# Patient Record
Sex: Female | Born: 1970
Health system: Southern US, Community
[De-identification: ages and names within clinical notes are randomized; demographics above are authoritative.]

## PROBLEM LIST (undated history)

## (undated) DIAGNOSIS — J189 Pneumonia, unspecified organism: Secondary | ICD-10-CM

## (undated) DIAGNOSIS — N6452 Nipple discharge: Secondary | ICD-10-CM

## (undated) DIAGNOSIS — J40 Bronchitis, not specified as acute or chronic: Secondary | ICD-10-CM

## (undated) DIAGNOSIS — T8859XA Other complications of anesthesia, initial encounter: Secondary | ICD-10-CM

## (undated) DIAGNOSIS — T4145XA Adverse effect of unspecified anesthetic, initial encounter: Secondary | ICD-10-CM

## (undated) HISTORY — PX: TONSILECTOMY, ADENOIDECTOMY, BILATERAL MYRINGOTOMY AND TUBES: SHX2538

## (undated) HISTORY — PX: OTHER SURGICAL HISTORY: SHX169

## (undated) HISTORY — PX: TONSILLECTOMY: SUR1361

## (undated) HISTORY — PX: CHOLECYSTECTOMY: SHX55

## (undated) HISTORY — DX: Nipple discharge: N64.52

## (undated) HISTORY — PX: GANGLION CYST EXCISION: SHX1691

## (undated) HISTORY — PX: DILATION AND CURETTAGE OF UTERUS: SHX78

## (undated) HISTORY — DX: Bronchitis, not specified as acute or chronic: J40

## (undated) HISTORY — PX: LEG SURGERY: SHX1003

## (undated) HISTORY — DX: Pneumonia, unspecified organism: J18.9

---

## 1998-09-23 ENCOUNTER — Encounter: Admission: RE | Admit: 1998-09-23 | Discharge: 1998-12-22 | Payer: Self-pay | Admitting: Internal Medicine

## 1999-01-27 ENCOUNTER — Other Ambulatory Visit: Admission: RE | Admit: 1999-01-27 | Discharge: 1999-01-27 | Payer: Self-pay | Admitting: *Deleted

## 2000-02-28 ENCOUNTER — Emergency Department (HOSPITAL_COMMUNITY): Admission: EM | Admit: 2000-02-28 | Discharge: 2000-02-28 | Payer: Self-pay | Admitting: Emergency Medicine

## 2000-03-15 ENCOUNTER — Other Ambulatory Visit: Admission: RE | Admit: 2000-03-15 | Discharge: 2000-03-15 | Payer: Self-pay | Admitting: *Deleted

## 2000-05-10 ENCOUNTER — Other Ambulatory Visit: Admission: RE | Admit: 2000-05-10 | Discharge: 2000-05-10 | Payer: Self-pay | Admitting: *Deleted

## 2000-07-04 ENCOUNTER — Other Ambulatory Visit: Admission: RE | Admit: 2000-07-04 | Discharge: 2000-07-04 | Payer: Self-pay | Admitting: Otolaryngology

## 2000-07-04 ENCOUNTER — Encounter (INDEPENDENT_AMBULATORY_CARE_PROVIDER_SITE_OTHER): Payer: Self-pay

## 2001-02-20 ENCOUNTER — Encounter: Payer: Self-pay | Admitting: Emergency Medicine

## 2001-02-20 ENCOUNTER — Emergency Department (HOSPITAL_COMMUNITY): Admission: EM | Admit: 2001-02-20 | Discharge: 2001-02-20 | Payer: Self-pay | Admitting: Emergency Medicine

## 2001-04-09 ENCOUNTER — Other Ambulatory Visit: Admission: RE | Admit: 2001-04-09 | Discharge: 2001-04-09 | Payer: Self-pay | Admitting: *Deleted

## 2002-03-06 ENCOUNTER — Other Ambulatory Visit: Admission: RE | Admit: 2002-03-06 | Discharge: 2002-03-06 | Payer: Self-pay | Admitting: *Deleted

## 2003-07-29 ENCOUNTER — Other Ambulatory Visit: Admission: RE | Admit: 2003-07-29 | Discharge: 2003-07-29 | Payer: Self-pay | Admitting: *Deleted

## 2004-07-21 ENCOUNTER — Other Ambulatory Visit: Admission: RE | Admit: 2004-07-21 | Discharge: 2004-07-21 | Payer: Self-pay | Admitting: Obstetrics and Gynecology

## 2004-07-27 ENCOUNTER — Encounter: Admission: RE | Admit: 2004-07-27 | Discharge: 2004-07-27 | Payer: Self-pay | Admitting: Obstetrics and Gynecology

## 2005-11-23 ENCOUNTER — Ambulatory Visit: Payer: Self-pay | Admitting: Internal Medicine

## 2005-11-30 ENCOUNTER — Ambulatory Visit (HOSPITAL_COMMUNITY): Admission: RE | Admit: 2005-11-30 | Discharge: 2005-11-30 | Payer: Self-pay | Admitting: Internal Medicine

## 2005-12-06 ENCOUNTER — Emergency Department (HOSPITAL_COMMUNITY): Admission: EM | Admit: 2005-12-06 | Discharge: 2005-12-06 | Payer: Self-pay | Admitting: Emergency Medicine

## 2005-12-09 ENCOUNTER — Ambulatory Visit: Payer: Self-pay | Admitting: Internal Medicine

## 2005-12-16 ENCOUNTER — Ambulatory Visit (HOSPITAL_COMMUNITY): Admission: RE | Admit: 2005-12-16 | Discharge: 2005-12-16 | Payer: Self-pay | Admitting: Internal Medicine

## 2006-01-25 ENCOUNTER — Ambulatory Visit (HOSPITAL_COMMUNITY): Admission: RE | Admit: 2006-01-25 | Discharge: 2006-01-25 | Payer: Self-pay | Admitting: *Deleted

## 2010-02-01 ENCOUNTER — Inpatient Hospital Stay (HOSPITAL_COMMUNITY)
Admission: EM | Admit: 2010-02-01 | Discharge: 2010-02-05 | Payer: Self-pay | Source: Home / Self Care | Attending: Internal Medicine | Admitting: Internal Medicine

## 2010-02-02 LAB — POCT I-STAT 3, ART BLOOD GAS (G3+)
Acid-Base Excess: 5 mmol/L — ABNORMAL HIGH (ref 0.0–2.0)
Bicarbonate: 28.2 mEq/L — ABNORMAL HIGH (ref 20.0–24.0)
O2 Saturation: 92 %
TCO2: 29 mmol/L (ref 0–100)
pCO2 arterial: 37 mmHg (ref 35.0–45.0)
pH, Arterial: 7.491 — ABNORMAL HIGH (ref 7.350–7.400)
pO2, Arterial: 59 mmHg — ABNORMAL LOW (ref 80.0–100.0)

## 2010-02-02 LAB — URINALYSIS, ROUTINE W REFLEX MICROSCOPIC
Bilirubin Urine: NEGATIVE
Ketones, ur: 15 mg/dL — AB
Leukocytes, UA: NEGATIVE
Nitrite: POSITIVE — AB
Protein, ur: 30 mg/dL — AB
Specific Gravity, Urine: 1.011 (ref 1.005–1.030)
Urine Glucose, Fasting: 100 mg/dL — AB
Urobilinogen, UA: 1 mg/dL (ref 0.0–1.0)
pH: 6 (ref 5.0–8.0)

## 2010-02-02 LAB — CBC
HCT: 43.3 % (ref 36.0–46.0)
Hemoglobin: 13.9 g/dL (ref 12.0–15.0)
MCH: 25.8 pg — ABNORMAL LOW (ref 26.0–34.0)
MCHC: 32.1 g/dL (ref 30.0–36.0)
MCV: 80.3 fL (ref 78.0–100.0)
Platelets: 233 10*3/uL (ref 150–400)
RBC: 5.39 MIL/uL — ABNORMAL HIGH (ref 3.87–5.11)
RDW: 14.5 % (ref 11.5–15.5)
WBC: 6.4 10*3/uL (ref 4.0–10.5)

## 2010-02-02 LAB — BASIC METABOLIC PANEL
BUN: 8 mg/dL (ref 6–23)
CO2: 26 mEq/L (ref 19–32)
Calcium: 8.3 mg/dL — ABNORMAL LOW (ref 8.4–10.5)
Chloride: 97 mEq/L (ref 96–112)
Creatinine, Ser: 0.75 mg/dL (ref 0.4–1.2)
GFR calc Af Amer: >60 mL/min (ref >60)
GFR calc non Af Amer: >60 mL/min (ref >60)
Glucose, Bld: 251 mg/dL — ABNORMAL HIGH (ref 70–99)
Potassium: 3.7 mEq/L (ref 3.5–5.1)
Sodium: 135 mEq/L (ref 135–145)

## 2010-02-02 LAB — GLUCOSE, CAPILLARY: Glucose-Capillary: 336 mg/dL — ABNORMAL HIGH (ref 70–99)

## 2010-02-02 LAB — TROPONIN I: Troponin I: 0.02 ng/mL (ref 0.00–0.06)

## 2010-02-02 LAB — DIFFERENTIAL
Basophils Absolute: 0 10*3/uL (ref 0.0–0.1)
Basophils Relative: 0 % (ref 0–1)
Eosinophils Absolute: 0 10*3/uL (ref 0.0–0.7)
Eosinophils Relative: 0 % (ref 0–5)
Lymphocytes Relative: 11 % — ABNORMAL LOW (ref 12–46)
Lymphs Abs: 0.7 10*3/uL (ref 0.7–4.0)
Monocytes Absolute: 0.1 10*3/uL (ref 0.1–1.0)
Monocytes Relative: 2 % — ABNORMAL LOW (ref 3–12)
Neutro Abs: 5.6 10*3/uL (ref 1.7–7.7)
Neutrophils Relative %: 87 % — ABNORMAL HIGH (ref 43–77)

## 2010-02-02 LAB — URINE MICROSCOPIC-ADD ON

## 2010-02-02 LAB — CK TOTAL AND CKMB (NOT AT ARMC)
CK, MB: 6.6 ng/mL (ref 0.3–4.0)
Relative Index: 0.7 (ref 0.0–2.5)
Total CK: 979 U/L — ABNORMAL HIGH (ref 7–177)

## 2010-02-03 LAB — CBC
HCT: 42.8 % (ref 36.0–46.0)
HCT: 41.9 % (ref 36.0–46.0)
Hemoglobin: 13.4 g/dL (ref 12.0–15.0)
Hemoglobin: 13.3 g/dL (ref 12.0–15.0)
MCH: 25.8 pg — ABNORMAL LOW (ref 26.0–34.0)
MCH: 25.9 pg — ABNORMAL LOW (ref 26.0–34.0)
MCHC: 31.3 g/dL (ref 30.0–36.0)
MCHC: 31.7 g/dL (ref 30.0–36.0)
MCV: 82.3 fL (ref 78.0–100.0)
MCV: 81.7 fL (ref 78.0–100.0)
Platelets: 270 10*3/uL (ref 150–400)
Platelets: 260 10*3/uL (ref 150–400)
RBC: 5.2 MIL/uL — ABNORMAL HIGH (ref 3.87–5.11)
RBC: 5.13 MIL/uL — ABNORMAL HIGH (ref 3.87–5.11)
RDW: 14.4 % (ref 11.5–15.5)
RDW: 14.5 % (ref 11.5–15.5)
WBC: 8.6 10*3/uL (ref 4.0–10.5)
WBC: 9 10*3/uL (ref 4.0–10.5)

## 2010-02-03 LAB — LIPID PANEL
Cholesterol: 167 mg/dL (ref 0–200)
HDL: 24 mg/dL — ABNORMAL LOW (ref >39)
LDL Cholesterol: 118 mg/dL — ABNORMAL HIGH (ref 0–99)
Total CHOL/HDL Ratio: 7 RATIO
Triglycerides: 123 mg/dL (ref <150)
VLDL: 25 mg/dL (ref 0–40)

## 2010-02-03 LAB — BASIC METABOLIC PANEL
BUN: 12 mg/dL (ref 6–23)
BUN: 18 mg/dL (ref 6–23)
CO2: 29 mEq/L (ref 19–32)
CO2: 31 mEq/L (ref 19–32)
Calcium: 8.4 mg/dL (ref 8.4–10.5)
Calcium: 9 mg/dL (ref 8.4–10.5)
Chloride: 98 mEq/L (ref 96–112)
Chloride: 92 mEq/L — ABNORMAL LOW (ref 96–112)
Creatinine, Ser: 0.79 mg/dL (ref 0.4–1.2)
Creatinine, Ser: 0.77 mg/dL (ref 0.4–1.2)
GFR calc Af Amer: >60 mL/min (ref >60)
GFR calc Af Amer: >60 mL/min (ref >60)
GFR calc non Af Amer: >60 mL/min (ref >60)
GFR calc non Af Amer: >60 mL/min (ref >60)
Glucose, Bld: 208 mg/dL — ABNORMAL HIGH (ref 70–99)
Glucose, Bld: 256 mg/dL — ABNORMAL HIGH (ref 70–99)
Potassium: 3.7 mEq/L (ref 3.5–5.1)
Potassium: 3.8 mEq/L (ref 3.5–5.1)
Sodium: 137 mEq/L (ref 135–145)
Sodium: 135 mEq/L (ref 135–145)

## 2010-02-03 LAB — CARDIAC PANEL(CRET KIN+CKTOT+MB+TROPI)
CK, MB: 5 ng/mL — ABNORMAL HIGH (ref 0.3–4.0)
CK, MB: 5.7 ng/mL — ABNORMAL HIGH (ref 0.3–4.0)
Relative Index: 0.6 (ref 0.0–2.5)
Relative Index: 0.7 (ref 0.0–2.5)
Total CK: 823 U/L — ABNORMAL HIGH (ref 7–177)
Total CK: 780 U/L — ABNORMAL HIGH (ref 7–177)
Troponin I: <0.01 ng/mL (ref 0.00–0.06)
Troponin I: 0.01 ng/mL (ref 0.00–0.06)

## 2010-02-03 LAB — GLUCOSE, CAPILLARY
Glucose-Capillary: 190 mg/dL — ABNORMAL HIGH (ref 70–99)
Glucose-Capillary: 191 mg/dL — ABNORMAL HIGH (ref 70–99)
Glucose-Capillary: 253 mg/dL — ABNORMAL HIGH (ref 70–99)
Glucose-Capillary: 262 mg/dL — ABNORMAL HIGH (ref 70–99)
Glucose-Capillary: 223 mg/dL — ABNORMAL HIGH (ref 70–99)
Glucose-Capillary: 201 mg/dL — ABNORMAL HIGH (ref 70–99)
Glucose-Capillary: 199 mg/dL — ABNORMAL HIGH (ref 70–99)

## 2010-02-03 LAB — URINE CULTURE
Colony Count: NO GROWTH
Culture  Setup Time: 201201241536
Culture: NO GROWTH

## 2010-02-03 LAB — DIFFERENTIAL
Basophils Absolute: 0 10*3/uL (ref 0.0–0.1)
Basophils Absolute: 0 10*3/uL (ref 0.0–0.1)
Basophils Relative: 0 % (ref 0–1)
Basophils Relative: 0 % (ref 0–1)
Eosinophils Absolute: 0 10*3/uL (ref 0.0–0.7)
Eosinophils Absolute: 0 10*3/uL (ref 0.0–0.7)
Eosinophils Relative: 0 % (ref 0–5)
Eosinophils Relative: 0 % (ref 0–5)
Lymphocytes Relative: 16 % (ref 12–46)
Lymphocytes Relative: 15 % (ref 12–46)
Lymphs Abs: 1.3 10*3/uL (ref 0.7–4.0)
Lymphs Abs: 1.4 10*3/uL (ref 0.7–4.0)
Monocytes Absolute: 0.3 10*3/uL (ref 0.1–1.0)
Monocytes Absolute: 0.2 10*3/uL (ref 0.1–1.0)
Monocytes Relative: 3 % (ref 3–12)
Monocytes Relative: 2 % — ABNORMAL LOW (ref 3–12)
Neutro Abs: 7 10*3/uL (ref 1.7–7.7)
Neutro Abs: 7.4 10*3/uL (ref 1.7–7.7)
Neutrophils Relative %: 81 % — ABNORMAL HIGH (ref 43–77)
Neutrophils Relative %: 83 % — ABNORMAL HIGH (ref 43–77)

## 2010-02-03 LAB — EXPECTORATED SPUTUM ASSESSMENT W GRAM STAIN, RFLX TO RESP C

## 2010-02-03 LAB — HEMOGLOBIN A1C
Hgb A1c MFr Bld: 6.6 % — ABNORMAL HIGH (ref <5.7)
Mean Plasma Glucose: 143 mg/dL — ABNORMAL HIGH (ref <117)

## 2010-02-04 LAB — GLUCOSE, CAPILLARY
Glucose-Capillary: 269 mg/dL — ABNORMAL HIGH (ref 70–99)
Glucose-Capillary: 118 mg/dL — ABNORMAL HIGH (ref 70–99)
Glucose-Capillary: 148 mg/dL — ABNORMAL HIGH (ref 70–99)
Glucose-Capillary: 148 mg/dL — ABNORMAL HIGH (ref 70–99)

## 2010-02-05 LAB — CULTURE, RESPIRATORY W GRAM STAIN: Culture: NORMAL

## 2010-02-05 LAB — GLUCOSE, CAPILLARY
Glucose-Capillary: 287 mg/dL — ABNORMAL HIGH (ref 70–99)
Glucose-Capillary: 91 mg/dL (ref 70–99)
Glucose-Capillary: 185 mg/dL — ABNORMAL HIGH (ref 70–99)

## 2010-02-05 LAB — BASIC METABOLIC PANEL
BUN: 13 mg/dL (ref 6–23)
CO2: 32 mEq/L (ref 19–32)
Calcium: 8.3 mg/dL — ABNORMAL LOW (ref 8.4–10.5)
Chloride: 100 mEq/L (ref 96–112)
Creatinine, Ser: 0.67 mg/dL (ref 0.4–1.2)
GFR calc Af Amer: >60 mL/min (ref >60)
GFR calc non Af Amer: >60 mL/min (ref >60)
Glucose, Bld: 106 mg/dL — ABNORMAL HIGH (ref 70–99)
Potassium: 3.6 mEq/L (ref 3.5–5.1)
Sodium: 140 mEq/L (ref 135–145)

## 2010-02-08 LAB — CULTURE, BLOOD (ROUTINE X 2)
Culture  Setup Time: 201201240125
Culture  Setup Time: 201201240125
Culture: NO GROWTH
Culture: NO GROWTH

## 2010-02-08 LAB — GLUCOSE, CAPILLARY: Glucose-Capillary: 279 mg/dL — ABNORMAL HIGH (ref 70–99)

## 2010-03-18 NOTE — Discharge Summary (Addendum)
[name] [name], [name] [name]                  ACCOUNT NO.:  [id number]  MEDICAL RECORD NO.:  [id number]          PATIENT TYPE:  INP  LOCATION:  3006                         FACILITY:  [facility]  PHYSICIAN:  Kela Millin, M.D.DATE OF BIRTH:  02-May-1970  DATE OF ADMISSION:  02/01/2010 DATE OF DISCHARGE:  02/05/2010                        DISCHARGE SUMMARY - REFERRING   DISCHARGE DIAGNOSES: 1. Community-acquired pneumonia, right lung with bronchospasm. 2. Steroid-induced hyperglycemia/prediabetes. 3. Tobacco abuse -- the patient counseled to quit. 4. Morbid obesity. 5. Hypoxia -- secondary to above.  PROCEDURES AND STUDIES: 1. Chest x-ray on February 01, 2010 -- acute right lung pneumonia. 2. A 2-D echocardiogram on February 02, 2010 -- ejection fraction 60-     65%, wall motion normal.  No regional wall motion abnormalities.     Grade 1 diastolic dysfunction.  BRIEF HISTORY:  The patient is a 40 year old morbidly obese white femalewith the above-listed medical problems who presented with complaints of shortness of breath and cough productive of yellowish sputum.  She also was complaining of right-sided pleuritic chest pain.  It was noted that the patient does have a prior history of bronchitis and pneumonia even though she has not been diagnosed with COPD or asthma.  She admitted to smoking history of one pack a day for about 20 years.  The patient reported that she had been seen at the Urgent Care given steroids and has to come to the ED after a chest x-ray showed a pneumonia.  It was also noted that the patient does not have a prior history of diabetes, but stated that in the past when she had been on steroids, her blood glucose was elevated.  In the ED, a blood sugar was done and it was noted to be elevated at 251.  The patient denied any nausea, vomiting, diarrhea, dysuria, and no leg swelling.  She was admitted for further evaluation and management.  HOSPITAL COURSE: 1. Right lung  pneumonia with bronchospasm/underlying COPD -- upon     admission, the patient was noted to be hypoxic with an ABG     revealing a pH of 7.49 with a pCO2 of 37 and a pO2 of 59.  She was     placed on supplemental oxygen and started on nebulized     bronchodilators as well as IV antibiotics.  She was also placed on     a nicotine patch during this hospital stay.  Her symptoms gradually     improved with decrease in her cough and still has some pleuritic     pain, but it is improved at this time.  She was assessed for home     O2 and her ambulatory O2 sats dropped to 86% on room air and so she     will be discharged home on oxygen nasal cannula 2 L at this time     and is to follow up with her primary care physician for further     monitoring and assessment, as to her continuing of the oxygen.  The     patient has been counseled to quit tobacco and she voiced that  she     wants to do it and has started planning stopping completely. Steroid-induced hyperglycemia/prediabetes -- as discussed above, the patient's blood sugars were elevated at 251 on admission and she had been on steroids given at the Urgent Care.  A hemoglobin A1c was done here in the hospital and it was elevated at 6.6.  She was on steroids during this hospitalization as well and she was placed on Lantus 10 units with sliding scale coverage.  She was also placed on a modified carbohydrate diet.  She did receive inpatient diabetes teaching.  I am discharging the patient on Lantus since she is still on prednisone, which has been tapered and I have asked her to keep a log of her blood sugars and is to follow up with her primary care physician with a log of her blood sugars for further monitoring and management as clinically appropriate. 1. Cystitis -- the patient had urinalysis done on admission and it was     consistent with UTI.  She was started on empiric antibiotics and     urine cultures were sent and came back with no  growth.  She has     completed adequate antibiotics for cystitis and will not be     requiring any further antibiotics for this upon discharge. 2. Elevated CKs and MB -- the patient was noted to have a CK of 79     with an MB of 6.6 on admission and also was found to have poor R-     wave progression on EKG.  The patient did not have any anginal     symptoms during this hospital stay and a 2-D echocardiogram was     done and the results are as stated above with no wall motion     abnormalities.  The patient is to follow up outpatient with her     PCP. 3. Tobacco abuse -- the patient was counseled to quit tobacco. 4. Morbid obesity -- the patient received nutrition counseling during     this hospital stay.  She is to follow up outpatient.  DISCHARGE MEDICATIONS: 1. Combivent 2 puffs q.i.d. 2. Flexeril 10 mg t.i.d. p.r.n. 3. Mucinex 1200 mg p.o. b.i.d. 4. Tussionex 5 mL b.i.d. p.r.n. 5. Lantus 10 units subcu daily. 6. Levaquin 500 mg one p.o. daily for 7 days. 7. Claritin-D 10 mg p.o. daily. 8. Oxycodone 5 mg q.4 h. p.r.n. 9. Prednisone taper as per med rec.  FOLLOWUP CARE:  Primary care physician in the next week, the patient is to keep log of blood sugars and take to her follow-up appointment.  DISCHARGE CONDITION:  Improved/stable.     Kela Millin, M.D.     ACV/MEDQ  D:  02/05/2010  T:  02/05/2010  Job:  [id number]  cc:   Larina Earthly, M.D.  Electronically Signed by Donnalee Curry M.D. on 03/16/2010 05:24:14 PM Electronically Signed by Donnalee Curry M.D. on 03/16/2010 05:24:14 PM Electronically Signed by Donnalee Curry M.D. on 03/16/2010 07:32:29 PM

## 2010-05-28 NOTE — Assessment & Plan Note (Signed)
[facility]                           GASTROENTEROLOGY OFFICE NOTE   NAME:[name], [name]                           MRN:          [id number]  DATE:11/23/2005                            DOB:          19-Jan-1970    ASSESSMENT:  A 40 year old white woman with morbid obesity and polycystic  ovary syndrome that has had two spells of epigastric and right upper  quadrant pain radiating into the back.  An ultrasound was negative for  stones but showed hepatomegaly and fatty liver, which is not a surprise.  She has some mild residual abdominal discomfort but is better at this time.   RECOMMENDATIONS AND PLAN:  1. Schedule [facility] HIDA scan.  If abnormal, might need a surgical      consultation, though her obesity is an issue with respect to any type      of elective surgery.  2. Depending on the clinical course, consider further imaging with MRI      versus CT.  I think the hepatomegaly and fatty liver issues are      expected in her situation.  Some of her symptoms could be coming from      stretch of the capsule related to fatty liver as well.  3. She has been on some Nexium; she has had that for about 2 weeks.  I      think she can taper off of that.  If her symptoms flare back up then we      have more of an answer with respect to what is going on, though I think      this is more liver or biliary in origin rather than gastric.   HISTORY:  A 40 year old white woman who was in her usual state of health.  Her mom was due to have surgery and around that time she developed fairly  intense epigastric and right upper quadrant pain radiating to the back.  The  surgery was rescheduled, the symptoms improved, the symptoms was scheduled  again, and the day before she had pain again in a similar fashion.  This is  on October 19 and November 08, 2005.  Abdominal ultrasound revealed the  findings as above.  Laboratory testing demonstrated an ALT of 46 with top  normal 40,  but otherwise normal LFTs.  Her glucose was 118.  There were no  dilated bile ducts or any other imaging abnormalities on the ultrasound  reported.  She feels better at this time, though there is some residual  soreness similar to some problems with pleurisy she has had in the past  where she was sore.  Her ribs are not tender to touch.  Eating does not seem  to bother things at this point and the previous symptoms were not related to  food.  She has occasional irritable bowel symptoms with gas and bloating and  nervous stomach issues with some loose stools at times.  That is not new.  These other symptoms are new.   MEDICATIONS:  Nexium 40 mg daily.   ALLERGIES:  None known.  PAST MEDICAL HISTORY:  1. Hypertension.  2. Morbid obesity.  3. Polycystic ovary syndrome and metabolic syndrome diagnosed within the      last 1-2 years.  4. 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  5. Dyslipidemia.  6. Cervical dysplasia in 1992.  7. Corrective surgeries to the right leg and right wrist.  8. Ganglion cyst removal.  9. Tonsillectomy.  10.Hysteroscopy and dilatation and curettage, 2006.   FAMILY HISTORY:  She is adopted.   SOCIAL HISTORY:  She is married.  She is an Occupational psychologist,  lives with her husband.  She smokes a half pack per day.   REVIEW OF SYSTEMS:  Eyeglasses, allergies, back pain, pedal edema, insomnia,  irregular menstrual periods.  All other systems negative.   PHYSICAL EXAMINATION:  GENERAL:  Morbidly obese, height 5 feet 5 inches,  weight 338 pounds, blood pressure 138/60, pulse 84.  EYES:  Anicteric.  NECK:  Supple, no mass or thyromegaly.  CHEST:  Clear.  HEART:  S1, S2, no murmurs or gallops.  ABDOMEN:  Mildly tender in the right upper quadrant without organomegaly or  mass.  The ribs are okay.  I cannot palpate her liver or spleen.  LYMPHATIC:  No neck or supraclavicular nodes.  SKIN:  Somewhat hirsute.   NEUROLOGIC/PSYCHIATRIC:  She is alert and oriented x3.   I appreciate the opportunity to care for this patient.  I have reviewed  records sent from [facility].     Iva Boop, MD,FACG  Electronically Signed    CEG/MedQ  DD: 11/23/2005  DT: 11/23/2005  Job #: [id number]   cc:   Lupe Carney, M.D.  Guy Sandifer Henderson Cloud, M.D.

## 2010-05-28 NOTE — Assessment & Plan Note (Signed)
 [facility]                         GASTROENTEROLOGY OFFICE NOTE   NAME:LYLEAurielle, Slingerland                           MRN:          [id number]  DATE:12/16/2005                            DOB:          Feb 06, 1970    Ms. Esterline had her HIDA scan.  Her gallbladder did not fill despite the 6  mg of morphine.  She had a CT scan on November 27 when she went to the  emergency department and was evaluated by Dr. Baruch Merl.  Dr. Colin Benton  wanted her to go ahead and continue on and get her HIDA scan performed.  There was pericholecystic inflammatory change and thickened gallbladder  wall.  I called Ms. Beidler with her results.  She is aware.  She is going  to follow up with Dr. Colin Benton.  She has already seen Dr. Colin Benton and has  called her office today to let Dr. Colin Benton know about the HIDA scan  results.  I did tell her that if she developed severe pain and fever,  especially pain that was unremitting, she should go to the emergency  department.     Iva Boop, MD,FACG  Electronically Signed    CEG/MedQ  DD: 12/16/2005  DT: 12/17/2005  Job #: [id number]

## 2010-05-28 NOTE — Assessment & Plan Note (Signed)
[facility]                         GASTROENTEROLOGY OFFICE NOTE   NAME:LYLELumen, [name] [name]                           MRN:          [id number]  DATE:12/05/2005                            DOB:          [birth year]    [name] [name] had her HIDA scan performed. There was no slow filling of the  gallbladder and the radiologist tried to give her morphine, but she  could not take that because she did not have a driver and was not aware  that she needed one. Thus, the test is inconclusive at this time. She  has been well, without any recurrence of her epigastric/right upper  quadrant pain. I did receive labs from Dr. Felipa Eth showing AST 56, ALT 63  and an LDL of 160 on her cholesterol panel. Her glucose is 109. TSH was  normal. Bilirubin and alkaline phosphatase, total protein normal.   I explained the situation to her. The plan is to check an ANA, check a  hepatitis B and hepatitis C serologies and I want to see if we cannot  get her HIDA scan repeated at no charge given that she did not have a  driver. If she had a very low gallbladder ejection fraction, I think we  might have an answer. Otherwise, the plan is to wait for recurrent  symptoms and if she has a severe spell I have asked her to go to the ER  and have a CT scan performed or at least get evaluated and I think a CT  scan would be appropriate at that time. Will also prescribed Levsin SL 1-  2 q. 4 hours as needed for abdominal pain.     Iva Boop, MD,FACG  Electronically Signed    CEG/MedQ  DD: 12/05/2005  DT: 12/05/2005  Job #: [id number]   cc:   Larina Earthly, M.D.

## 2010-07-03 ENCOUNTER — Inpatient Hospital Stay (HOSPITAL_COMMUNITY)
Admission: EM | Admit: 2010-07-03 | Discharge: 2010-07-05 | DRG: 419 | Disposition: A | Discharge status: Home / Self Care | Payer: Self-pay | Attending: General Surgery | Admitting: General Surgery

## 2010-07-03 ENCOUNTER — Emergency Department (HOSPITAL_COMMUNITY): Payer: Self-pay

## 2010-07-03 DIAGNOSIS — F17200 Nicotine dependence, unspecified, uncomplicated: Secondary | ICD-10-CM | POA: Diagnosis present

## 2010-07-03 DIAGNOSIS — K8010 Calculus of gallbladder with chronic cholecystitis without obstruction: Secondary | ICD-10-CM | POA: Diagnosis present

## 2010-07-03 DIAGNOSIS — K8000 Calculus of gallbladder with acute cholecystitis without obstruction: Principal | ICD-10-CM | POA: Diagnosis present

## 2010-07-03 LAB — COMPREHENSIVE METABOLIC PANEL
ALT: 37 U/L — ABNORMAL HIGH (ref 0–35)
AST: 35 U/L (ref 0–37)
Albumin: 3.7 g/dL (ref 3.5–5.2)
Alkaline Phosphatase: 74 U/L (ref 39–117)
BUN: 8 mg/dL (ref 6–23)
CO2: 28 mEq/L (ref 19–32)
Calcium: 9.1 mg/dL (ref 8.4–10.5)
Chloride: 98 mEq/L (ref 96–112)
Creatinine, Ser: 0.58 mg/dL (ref 0.50–1.10)
GFR calc Af Amer: >60 mL/min (ref >60)
GFR calc non Af Amer: >60 mL/min (ref >60)
Glucose, Bld: 132 mg/dL — ABNORMAL HIGH (ref 70–99)
Potassium: 3.5 mEq/L (ref 3.5–5.1)
Sodium: 135 mEq/L (ref 135–145)
Total Bilirubin: 0.7 mg/dL (ref 0.3–1.2)
Total Protein: 7.3 g/dL (ref 6.0–8.3)

## 2010-07-03 LAB — CBC
HCT: 43.5 % (ref 36.0–46.0)
Hemoglobin: 14.7 g/dL (ref 12.0–15.0)
MCH: 27.1 pg (ref 26.0–34.0)
MCHC: 33.8 g/dL (ref 30.0–36.0)
MCV: 80.1 fL (ref 78.0–100.0)
Platelets: 214 10*3/uL (ref 150–400)
RBC: 5.43 MIL/uL — ABNORMAL HIGH (ref 3.87–5.11)
RDW: 13.2 % (ref 11.5–15.5)
WBC: 9.3 10*3/uL (ref 4.0–10.5)

## 2010-07-03 LAB — URINALYSIS, ROUTINE W REFLEX MICROSCOPIC
Bilirubin Urine: NEGATIVE
Glucose, UA: NEGATIVE mg/dL
Ketones, ur: 40 mg/dL — AB
Nitrite: NEGATIVE
Protein, ur: NEGATIVE mg/dL
Specific Gravity, Urine: 1.019 (ref 1.005–1.030)
Urobilinogen, UA: 0.2 mg/dL (ref 0.0–1.0)
pH: 6 (ref 5.0–8.0)

## 2010-07-03 LAB — LIPASE, BLOOD: Lipase: 17 U/L (ref 11–59)

## 2010-07-03 LAB — URINE MICROSCOPIC-ADD ON

## 2010-07-03 LAB — DIFFERENTIAL
Basophils Absolute: 0 10*3/uL (ref 0.0–0.1)
Basophils Relative: 0 % (ref 0–1)
Eosinophils Absolute: 0.1 10*3/uL (ref 0.0–0.7)
Eosinophils Relative: 1 % (ref 0–5)
Lymphocytes Relative: 15 % (ref 12–46)
Lymphs Abs: 1.4 10*3/uL (ref 0.7–4.0)
Monocytes Absolute: 0.4 10*3/uL (ref 0.1–1.0)
Monocytes Relative: 4 % (ref 3–12)
Neutro Abs: 7.4 10*3/uL (ref 1.7–7.7)
Neutrophils Relative %: 80 % — ABNORMAL HIGH (ref 43–77)

## 2010-07-04 ENCOUNTER — Inpatient Hospital Stay (HOSPITAL_COMMUNITY): Payer: Self-pay

## 2010-07-04 ENCOUNTER — Other Ambulatory Visit (INDEPENDENT_AMBULATORY_CARE_PROVIDER_SITE_OTHER): Payer: Self-pay | Admitting: General Surgery

## 2010-07-04 LAB — MRSA PCR SCREENING: MRSA by PCR: NEGATIVE

## 2010-07-04 LAB — POCT PREGNANCY, URINE: Preg Test, Ur: NEGATIVE

## 2010-07-04 MED ORDER — IOHEXOL 300 MG/ML  SOLN
125.0000 mL | Freq: Once | INTRAMUSCULAR | Status: AC | PRN
  Administered 2010-07-04: 125 mL via INTRAVENOUS

## 2010-07-04 NOTE — H&P (Signed)
  NAMEASHWINI, [name] [name] NO.:  [id number]  MEDICAL RECORD NO.:  [id number]  LOCATION:  1525                         FACILITY:  [facility]  PHYSICIAN:  Juanetta Gosling, MDDATE OF BIRTH:  1970-02-03  DATE OF ADMISSION:  07/03/2010 DATE OF DISCHARGE:                             HISTORY & PHYSICAL   CHIEF COMPLAINT:  Abdominal pain.  HISTORY OF PRESENT ILLNESS:  This is a 40 year old female with history in 2007 of a CT scan and a HIDA scan consistent with acute cholecystitis.  She was due to get gallbladder surgery at that time, but then had a death in the family and never rescheduled due to change in her insurance as well.  She has done fairly well since then and just has had a couple of episodes until this Wednesday when she began with epigastric right upper quadrant pain radiating to her back.  This got better on Friday.  She was back to her normal self.  She then ate again and woke up Saturday morning with a painful right upper quadrant epigastrium that continued on leading to her to come to the Emergency Room.  There was no relief.  It was aggravated by food.  She is nauseated.  She denied any emesis.  She denies having bowel movement. She has not eaten in a number of days due to this as well.  PAST MEDICAL HISTORY: 1. Borderline diabetes. 2. Morbid obesity.  PAST SURGICAL HISTORY:  Tonsillectomy.  FAMILY HISTORY:  Noncontributory.  SOCIAL HISTORY:  Smokes half a pack per day, drinks occasional alcohol. 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  MEDICATIONS:  None.  ALLERGIES:  None known.  REVIEW OF SYSTEMS:  Otherwise negative.  PHYSICAL EXAMINATION:  VITAL SIGNS:  Temperature 98.2, pulse 96, blood pressure 123/77, respirations 20, saturations 92-93 in room air. GENERAL:  She is a morbidly obese female, in no distress. HEENT:  She has no scleral icterus. HEART:  Regular rate and rhythm. LUNGS:   Clear bilaterally. ABDOMEN:  Soft, morbidly obese.  Her right upper quadrant is tender with positive Murphy sign.  LABORATORY EVALUATION:  Negative pregnancy test.  Urinalysis has large blood with 11-20 red cells on microscopy many bacteria and many squamous cells.  Lipase is 17.  Her CMET is significant only for a glucose of 132 and an ALT of 37, otherwise her liver function tests are within normal limits.  Her CBC is 9.3, 43.5 and 214.  DIAGNOSTIC STUDIES:  CT scan shows cholecystitis.  ASSESSMENT:  Cholecystitis.  PLAN:  I have discussed laparoscopic cholecystectomy.  We discussed at length the risks including an open cholecystectomy, common bile duct injury, bleeding and infection.  She understands these risks and we are going to proceed this morning with cholecystectomy.     Juanetta Gosling, MD     MCW/MEDQ  D:  07/04/2010  T:  07/04/2010  Job:  [id number]  Electronically Signed by Emelia Loron MD on 07/04/2010 11:01:09 AM

## 2010-07-05 LAB — URINE CULTURE
Colony Count: 25000
Culture  Setup Time: 201206240334

## 2010-07-05 LAB — CBC
HCT: 39.8 % (ref 36.0–46.0)
Hemoglobin: 12.8 g/dL (ref 12.0–15.0)
MCH: 26.4 pg (ref 26.0–34.0)
MCHC: 32.2 g/dL (ref 30.0–36.0)
MCV: 82.1 fL (ref 78.0–100.0)
Platelets: 204 10*3/uL (ref 150–400)
RBC: 4.85 MIL/uL (ref 3.87–5.11)
RDW: 13.2 % (ref 11.5–15.5)
WBC: 10.4 10*3/uL (ref 4.0–10.5)

## 2010-07-05 LAB — BASIC METABOLIC PANEL
BUN: 7 mg/dL (ref 6–23)
CO2: 29 mEq/L (ref 19–32)
Calcium: 8.7 mg/dL (ref 8.4–10.5)
Chloride: 100 mEq/L (ref 96–112)
Creatinine, Ser: 0.51 mg/dL (ref 0.50–1.10)
GFR calc Af Amer: >60 mL/min (ref >60)
GFR calc non Af Amer: >60 mL/min (ref >60)
Glucose, Bld: 168 mg/dL — ABNORMAL HIGH (ref 70–99)
Potassium: 3.8 mEq/L (ref 3.5–5.1)
Sodium: 135 mEq/L (ref 135–145)

## 2010-07-05 NOTE — Op Note (Signed)
NAMETIMAYA, BOJARSKI NO.:  [id number]  MEDICAL RECORD NO.:  [id number]  LOCATION:  1525                         FACILITY:  [facility]  PHYSICIAN:  Juanetta Gosling, MDDATE OF BIRTH:  03-30-1970  DATE OF PROCEDURE:  07/04/2010 DATE OF DISCHARGE:                              OPERATIVE REPORT   PREOPERATIVE DIAGNOSIS:  Cholecystitis.  POSTOPERATIVE DIAGNOSIS:  Acute on chronic cholecystitis.  PROCEDURE:  Laparoscopic cholecystectomy with cholangiogram.  SURGEON:  Juanetta Gosling, M.D.  ASSISTANT:  Dr. Felicity Pellegrini  ANESTHESIA:  General.  ANESTHESIOLOGIST:  Hezzie Bump. Okey Dupre, M.D.  SPECIMENS:  Gallbladder contents to pathology.  ESTIMATED BLOOD LOSS:  100 mL.  COMPLICATIONS:  None.  DRAINS:  None.  DISPOSITION:  Recovery room in stable condition.  INDICATIONS:  This is a 40 year old morbidly obese female with a history of prior cholecystitis 5 years ago, who returns with the same symptoms.  I discussed with her the risks and benefits of A laparoscopic cholecystectomy.  We decided to proceed.  PROCEDURE IN DETAIL:  After informed consent was taken, patient was administered 3 grams of intravenous Unasyn.  She was administered 5000 units of subcutaneous heparin due to her morbid obesity.  She was then placed under general endotracheal anesthesia without complication.  Her abdomen was prepped and draped in a standard sterile surgical fashion. Surgical time-out was then performed.  Her stomach was evacuated with an orogastric tube.  Due to her size, I elected to infiltrate 0.25% Marcaine in the left upper quadrant.  I accessed her abdomen with an OptiVu trocar.  This was done without difficulty.  I then insufflated the abdomen to 15 mmHg pressure.  I placed a 10 mm port above the umbilicus and three 5 mm ports in the epigastrium and right upper quadrant under direct vision after infiltration of local anesthetic without complication.  Following  this, we then noted to the omentum and duodenum were both very adherent to the gallbladder.  This was a very difficult dissection, eventually, we were able to remove the omentum as well as the duodenum without any injury. Eventually we were able to identify the gallbladder.  This appeared to have acute on chronic cholecystitis.  I used the needle to evacuate the contents of the gallbladder just so we could grasp it to perform the operation.  This was then retracted cephalad and lateral with a great degree of difficulty.  Eventually, I was able to obtain a critical view of safety.  I clearly saw the common duct as well as the cystic duct.  I then decided to perform a cholangiogram to confirm my anatomy.  I then placed a clip distal on the duct.  I made a cut into the duct, bile came from that.  I milked the duct.  There were no stones present.  I then placed a Cook catheter, put this into place, performed a cholangiogram. The cholangiogram showed that I was indeed in the cystic duct.  There was filling of the duodenum and filling of both sides of the liver.  I then removed the cholangiocatheter.  I placed three clips on the duct, divided the duct.  I then  treated the artery in a similar fashion.  The gallbladder was then removed from the liver bed with a great degree of difficulty.  I entered into the gallbladder in one place, but the entire gallbladder was eventually removed.  This was placed in an EndoCatch bag and removed from the umbilicus.  I had to enlarge my incision just to remove the gallbladder.  There were numerous areas that were bleeding following this from the liver.  This was then irrigated with several liters.  I then eventually was able to obtain hemostasis with cautery. I placed two pieces of Surgicel snow in the liver bed as well.  I then removed all the trocars and evacuated the pneumoperitoneum.  I then closed the umbilical incision with two figure-of-eight 0  Vicryl stitches.  I then closed the incisions with 4-0 Monocryl and Dermabond. She tolerated this well, was extubated in the operating room, was transferred to recovery room in stable condition.     Juanetta Gosling, MD     MCW/MEDQ  D:  07/04/2010  T:  07/04/2010  Job:  [id number]  Electronically Signed by Emelia Loron MD on 07/05/2010 01:47:36 PM

## 2010-07-27 ENCOUNTER — Encounter (INDEPENDENT_AMBULATORY_CARE_PROVIDER_SITE_OTHER): Payer: Self-pay | Admitting: General Surgery

## 2010-07-27 ENCOUNTER — Ambulatory Visit (INDEPENDENT_AMBULATORY_CARE_PROVIDER_SITE_OTHER): Payer: Self-pay | Admitting: General Surgery

## 2010-07-27 DIAGNOSIS — K8010 Calculus of gallbladder with chronic cholecystitis without obstruction: Secondary | ICD-10-CM

## 2010-07-27 DIAGNOSIS — Z09 Encounter for follow-up examination after completed treatment for conditions other than malignant neoplasm: Secondary | ICD-10-CM

## 2010-07-27 NOTE — Patient Instructions (Signed)
Call if you have any problems 

## 2010-07-27 NOTE — Progress Notes (Signed)
[name] [name] is a 40 y.o. female  who had a laparoscopic cholecystectomy with intraoperative cholangiogram on 07/05/10.  The pathology report confirmed Chronic Cholecystitis and cholelithiasis.  The patient reports that they are feeling well with normal bowel movements and good appetite.  The pre-operative symptoms of abdominal pain, nausea, and vomiting have resolved.    Physical examination - Incisions appear well-healed with no sign of infection or bleeding.   Abdomen - soft, non-tender  Impression:  s/p laparoscopic cholecystectomy  Plan:  Resume regular diet and full activity.  Follow-up on a PRN basis.

## 2012-01-14 IMAGING — CR DG CHEST 1V PORT
1 series · 1 of 1 positions shown · non-contrast
Comparison: None.

CLINICAL DATA: Short of breath

PORTABLE CHEST - 1 VIEW

[view not recorded]
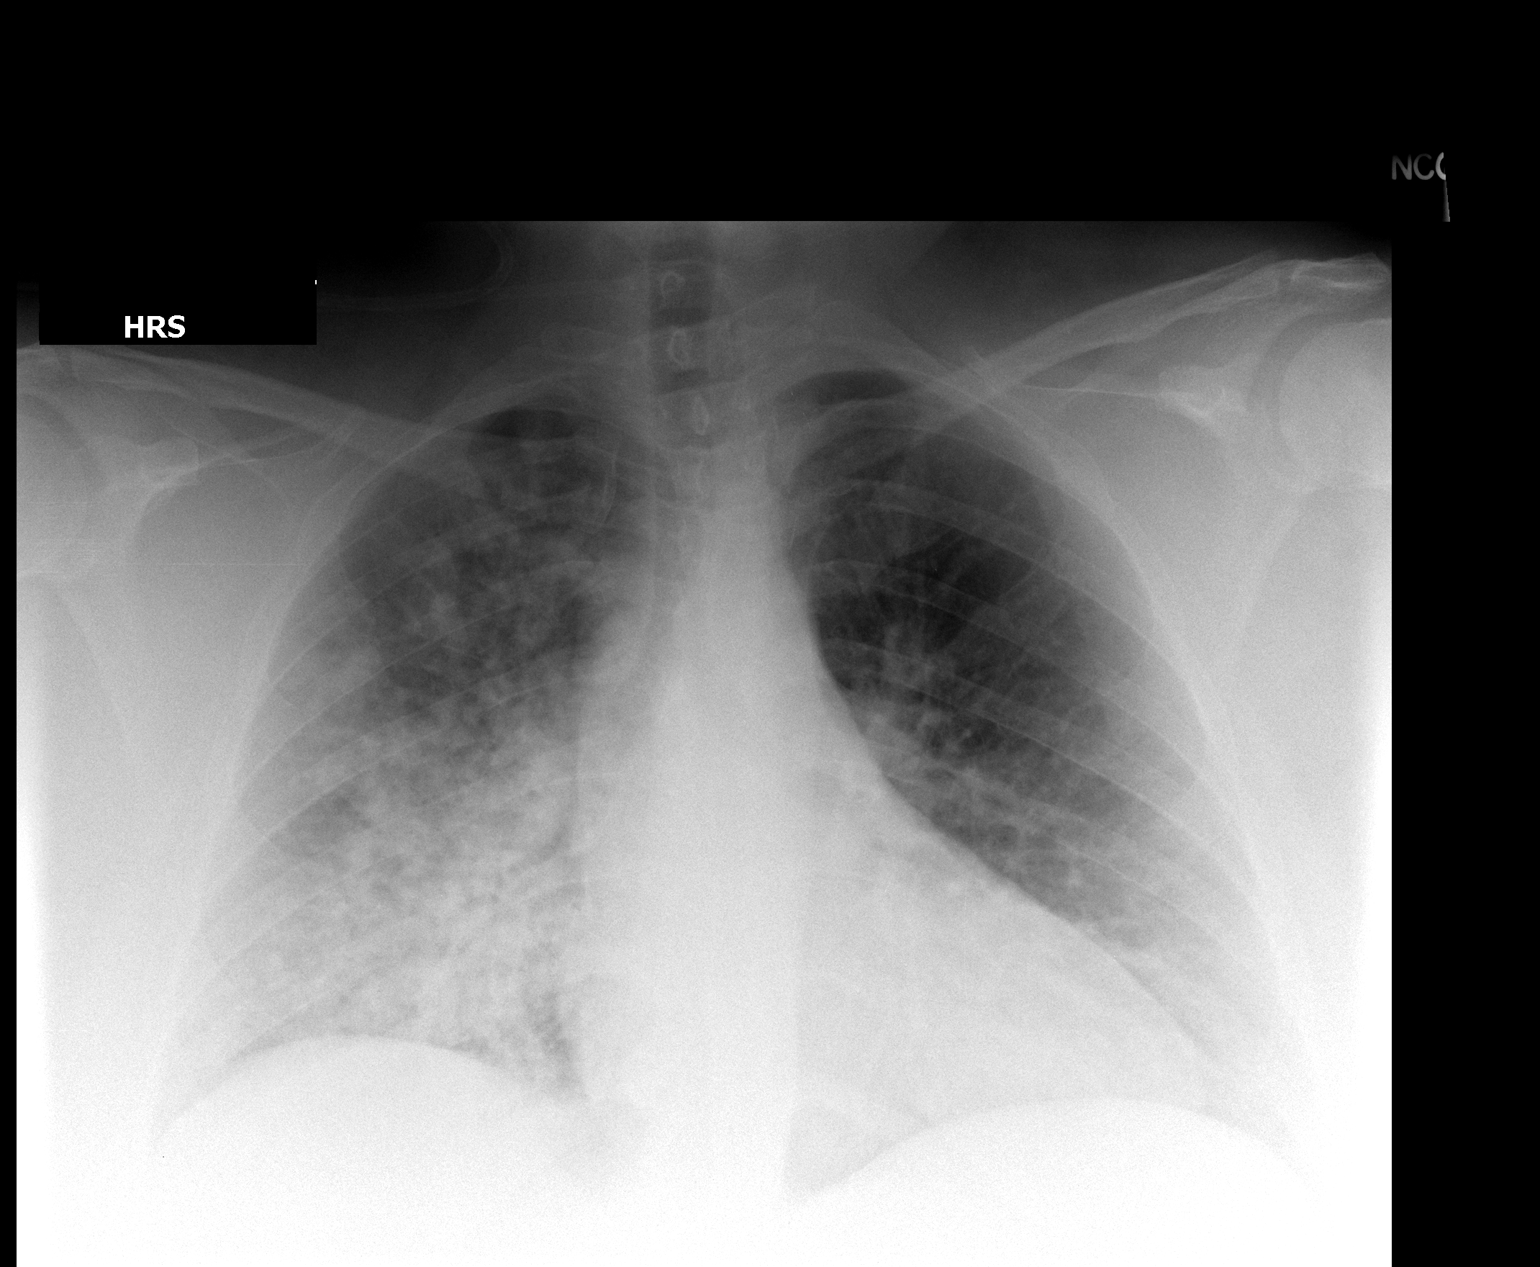

[1 of 1 positions shown; findings below may reference images not displayed]

FINDINGS: Heart size within normal limits considering AP
projection.  There is right lower lobe pneumonia.  There may be
right upper lobe involvement as well.  Left lung clear.  No
significant pleural fluid.
IMPRESSION: Acute right lung pneumonia.

## 2012-06-08 ENCOUNTER — Other Ambulatory Visit (HOSPITAL_COMMUNITY)
Admission: RE | Admit: 2012-06-08 | Discharge: 2012-06-08 | Disposition: A | Discharge status: Home / Self Care | Payer: Self-pay | Source: Ambulatory Visit | Attending: Family Medicine | Admitting: Family Medicine

## 2012-06-08 ENCOUNTER — Other Ambulatory Visit: Payer: Self-pay | Admitting: Family Medicine

## 2012-06-08 DIAGNOSIS — Z124 Encounter for screening for malignant neoplasm of cervix: Secondary | ICD-10-CM | POA: Insufficient documentation

## 2012-06-08 DIAGNOSIS — Z1151 Encounter for screening for human papillomavirus (HPV): Secondary | ICD-10-CM | POA: Insufficient documentation

## 2012-06-14 IMAGING — CR DG ABDOMEN ACUTE W/ 1V CHEST
6 series · 6 of 6 positions shown · non-contrast
Comparison: CT 12/06/2005

CLINICAL DATA: Right upper quadrant abdominal pain

ACUTE ABDOMEN SERIES (ABDOMEN 2 VIEW & CHEST 1 VIEW)

[w chest pa *]
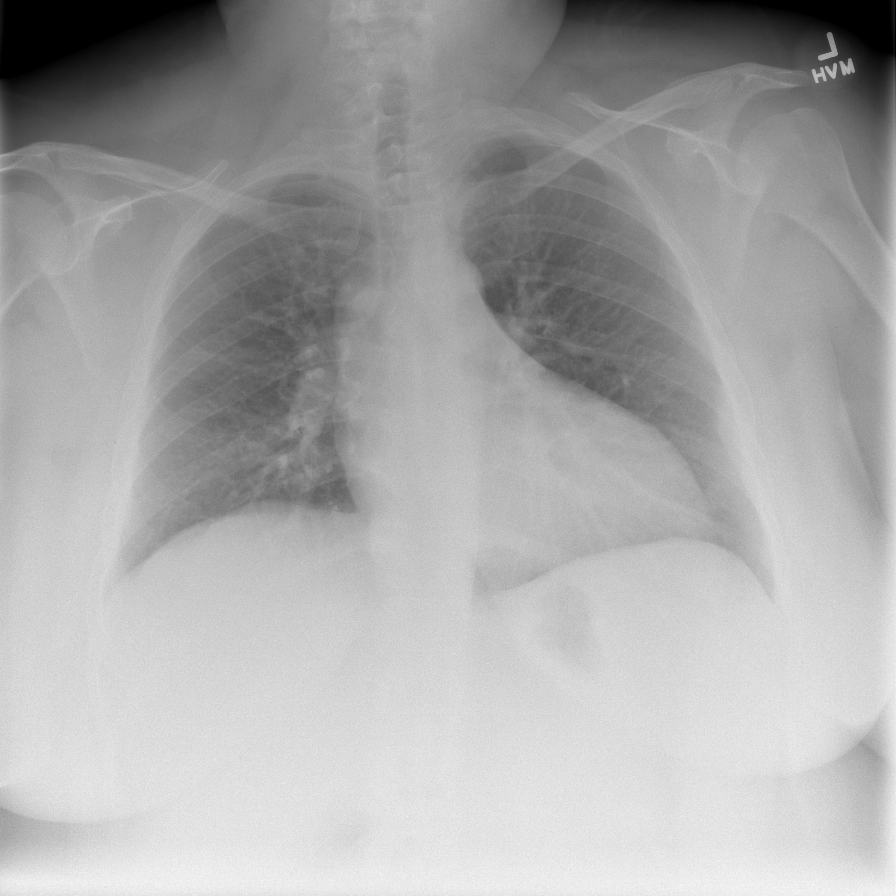

[w abdomen upright *]
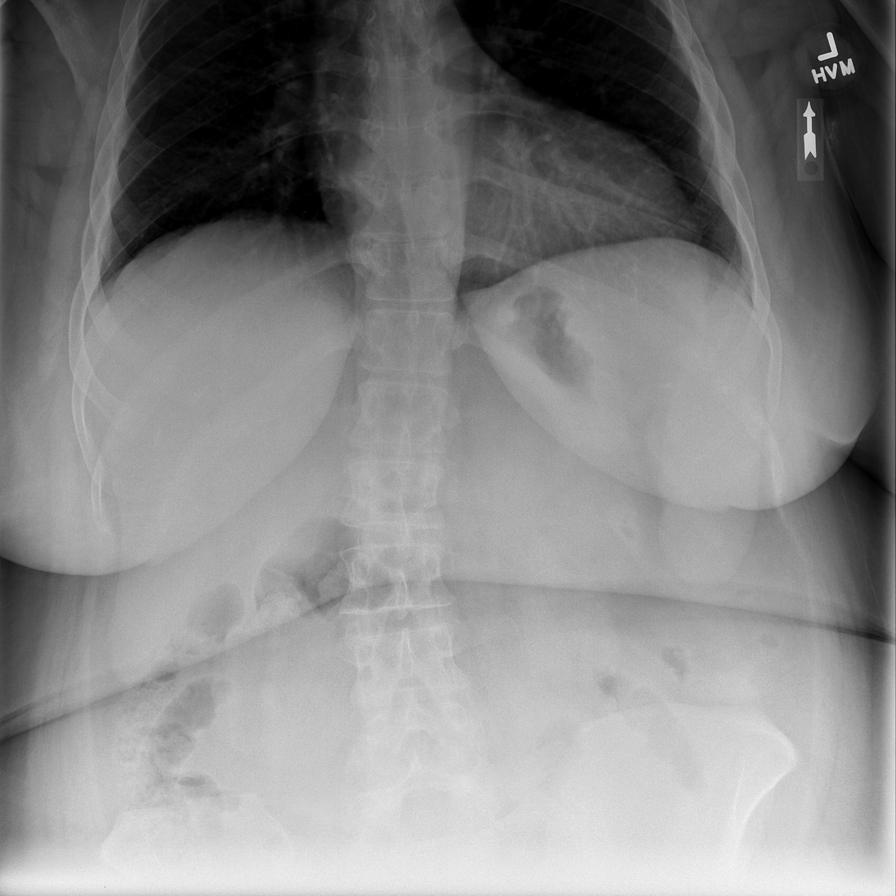

[t abdomen supine * (1 of 4)]
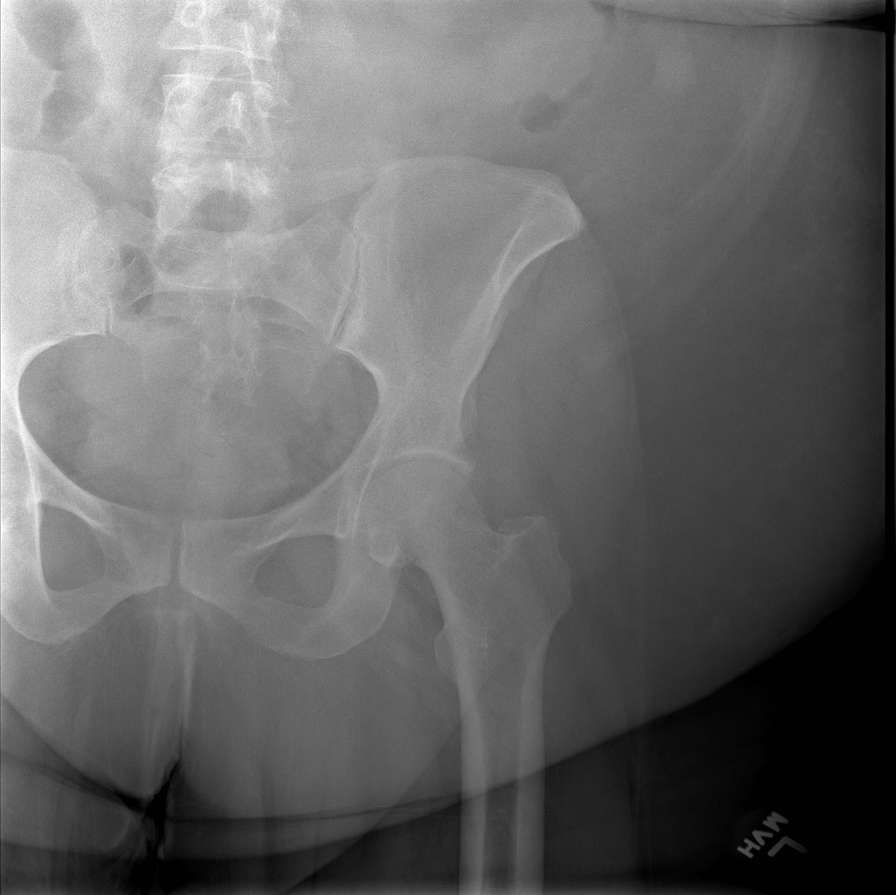

[t abdomen supine * (2 of 4)]
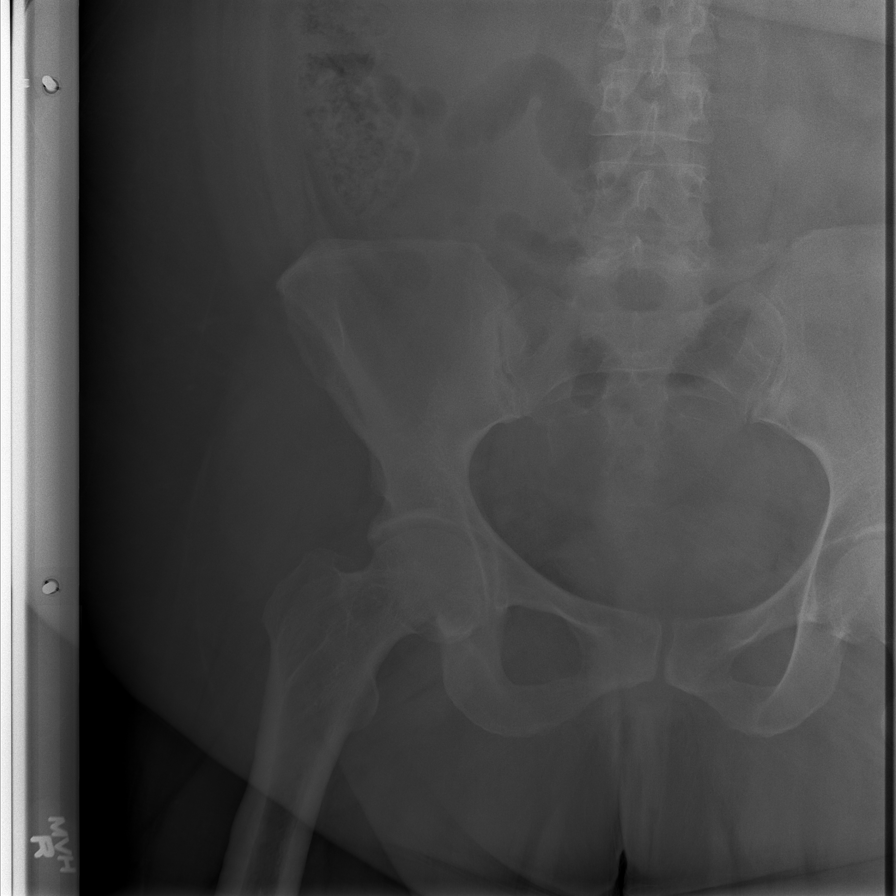

[t abdomen supine * (3 of 4)]
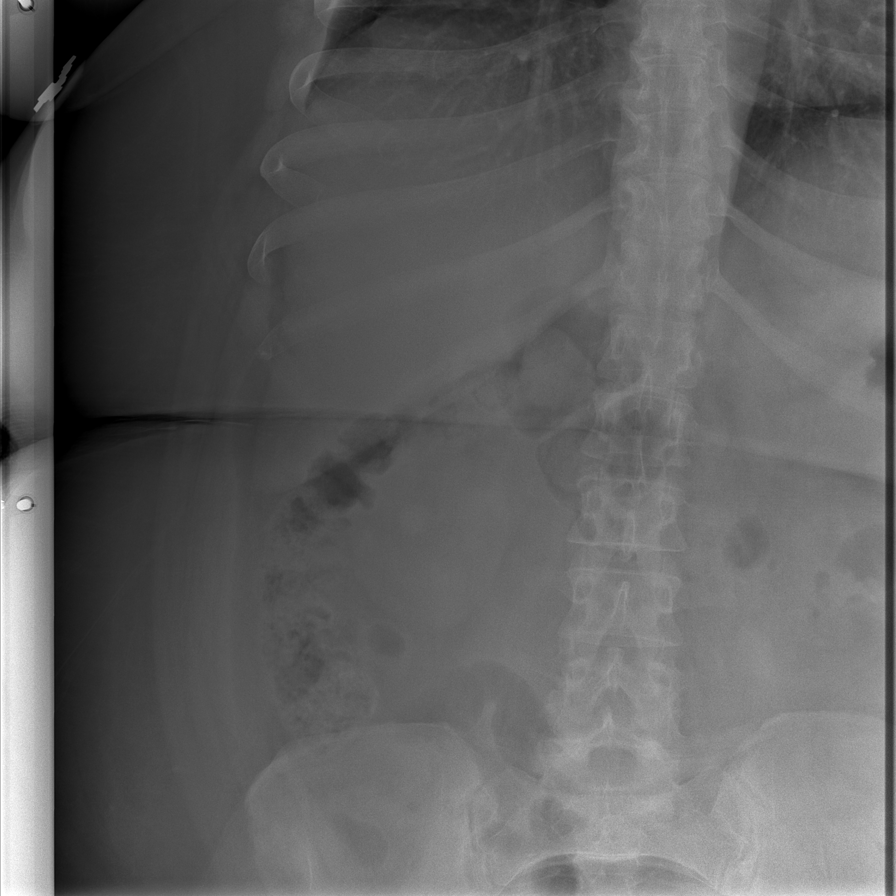

[t abdomen supine * (4 of 4)]
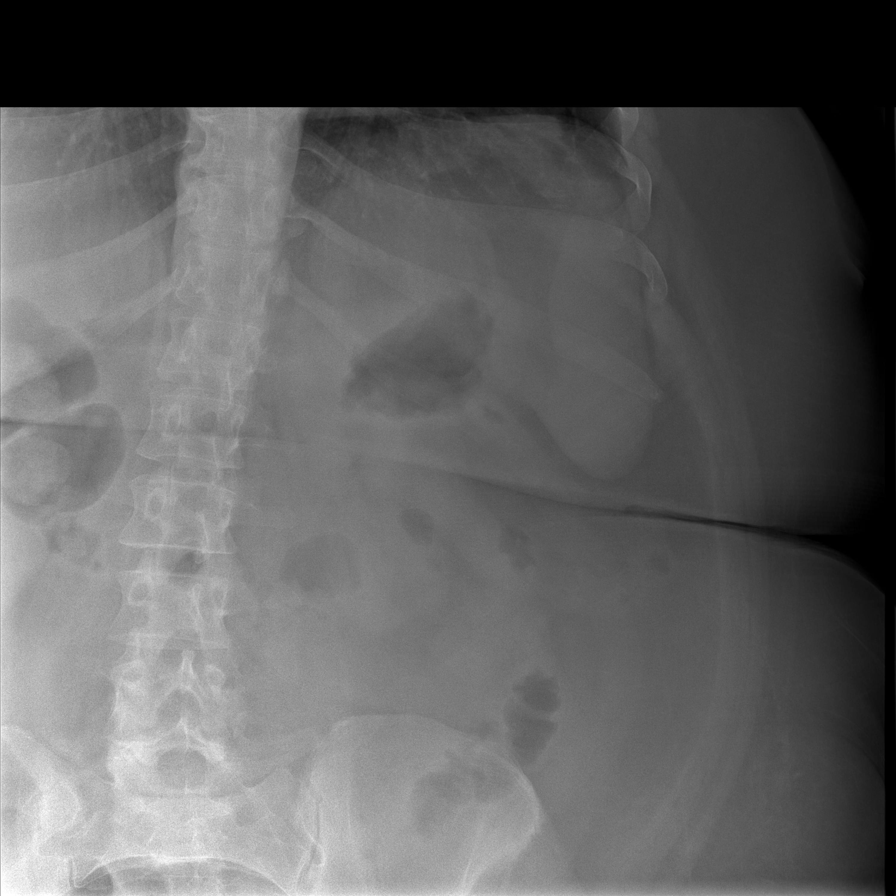

[6 of 6 positions shown; findings below may reference images not displayed]

FINDINGS: Remote left-sided rib fractures are noted.  Heart size is
normal.  Lungs are hypo aerated but clear.  No pleural effusion.
No free air beneath the diaphragms.

Mild rightward curvature of the lumbar spine centered at L3 noted.
Normal bowel gas pattern. Fine detail is obscured by patient body
habitus.  No acute osseous abnormality.  No abnormal calcific
opacity.
IMPRESSION: No acute cardiopulmonary process.  Normal bowel gas pattern.

## 2012-06-15 IMAGING — RF DG CHOLANGIOGRAM OPERATIVE
1 series · 4 of 4 positions shown · non-contrast
Comparison: CT abdomen 07/04/2010.

Fluoroscopy time of 0.4 minutes was utilized.

CLINICAL DATA: 40-year-old female with cholecystitis.

INTRAOPERATIVE CHOLANGIOGRAM
TECHNIQUE: Cholangiographic images from the C-arm fluoroscopic
device were submitted for interpretation post-operatively.  Please
see the procedural report for the amount of contrast and the
fluoroscopy time utilized.

[Series 1: run · 4 of 132 frames shown]
[frame 20/132]
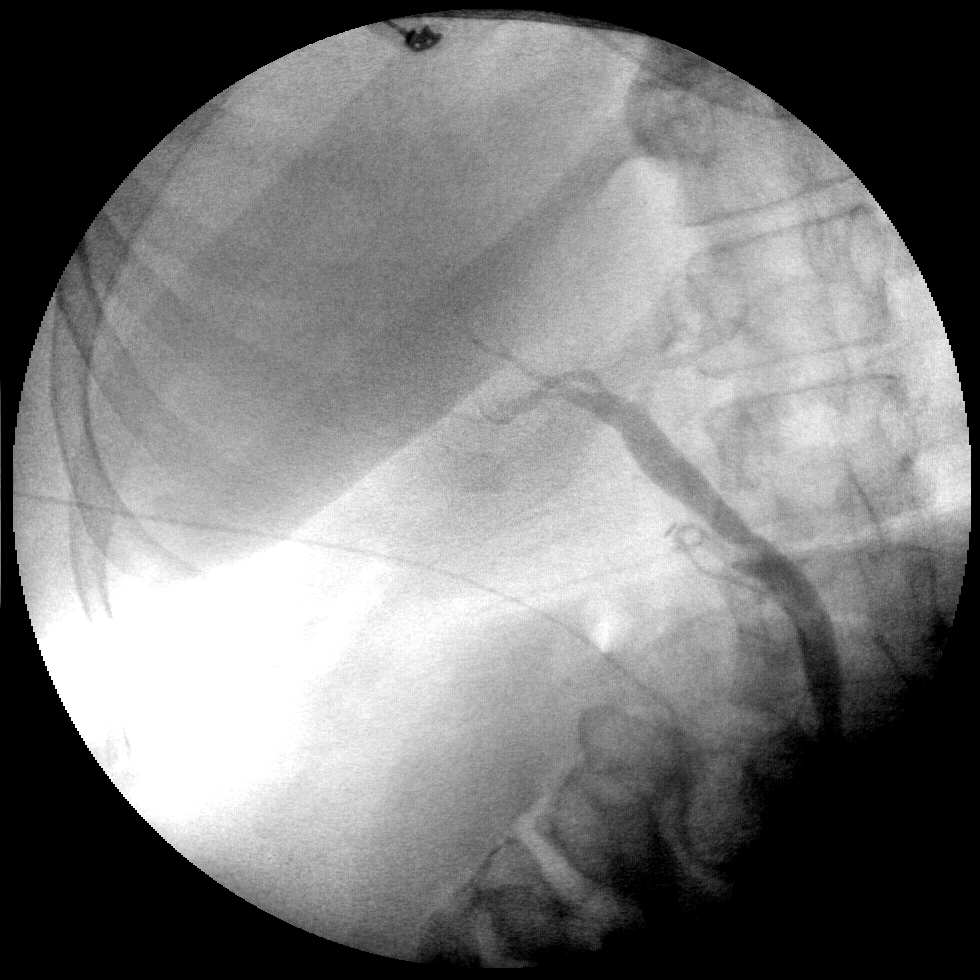
[frame 67/132]
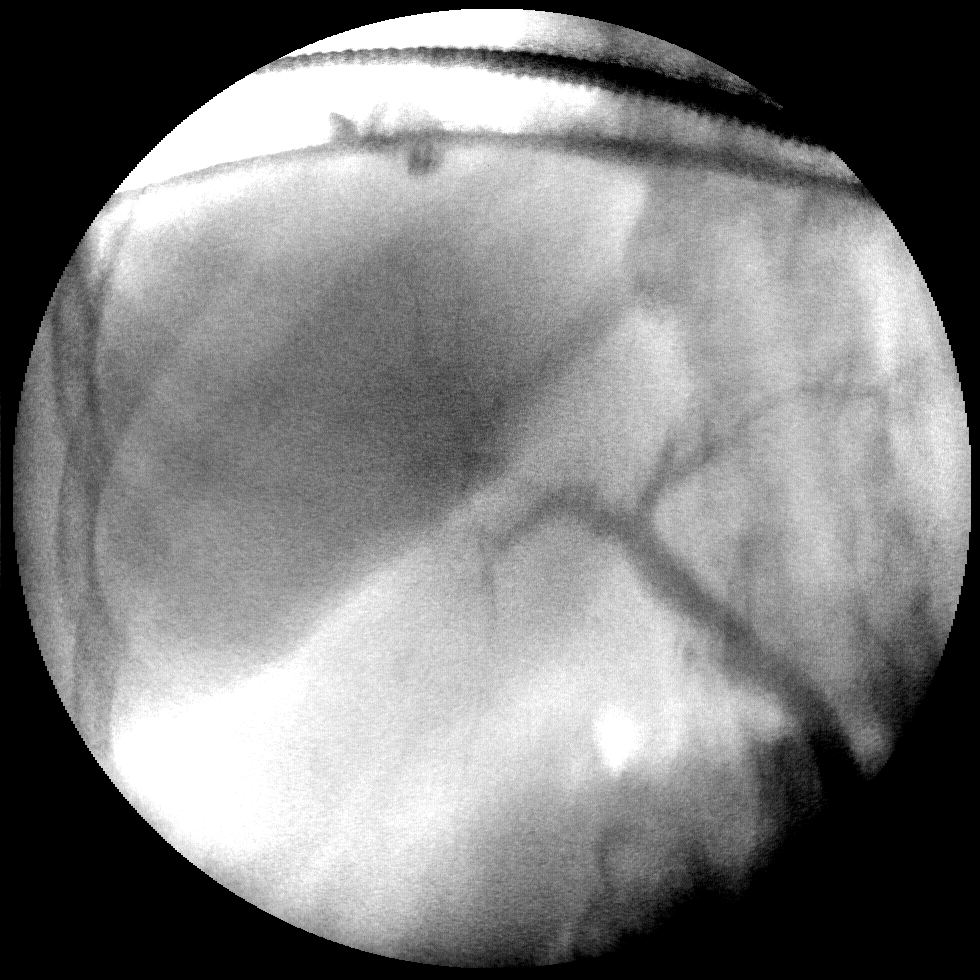
[frame 78/132]
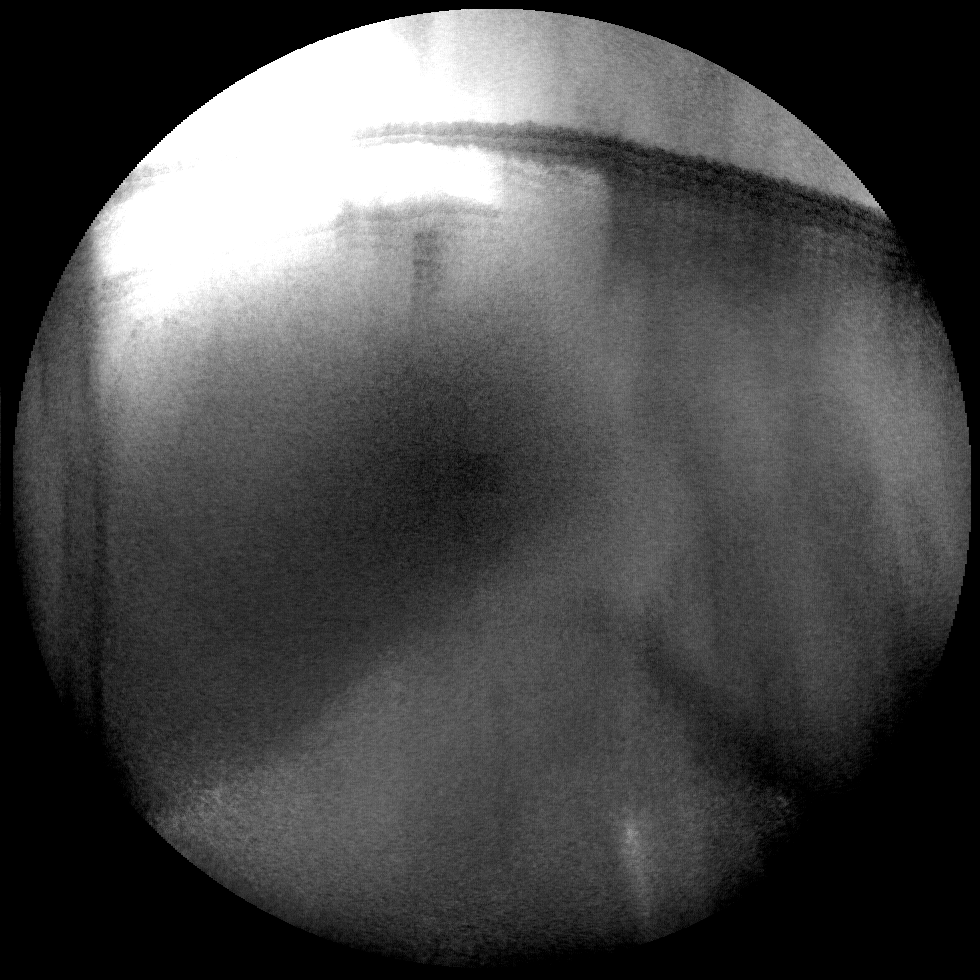
[frame 113/132]
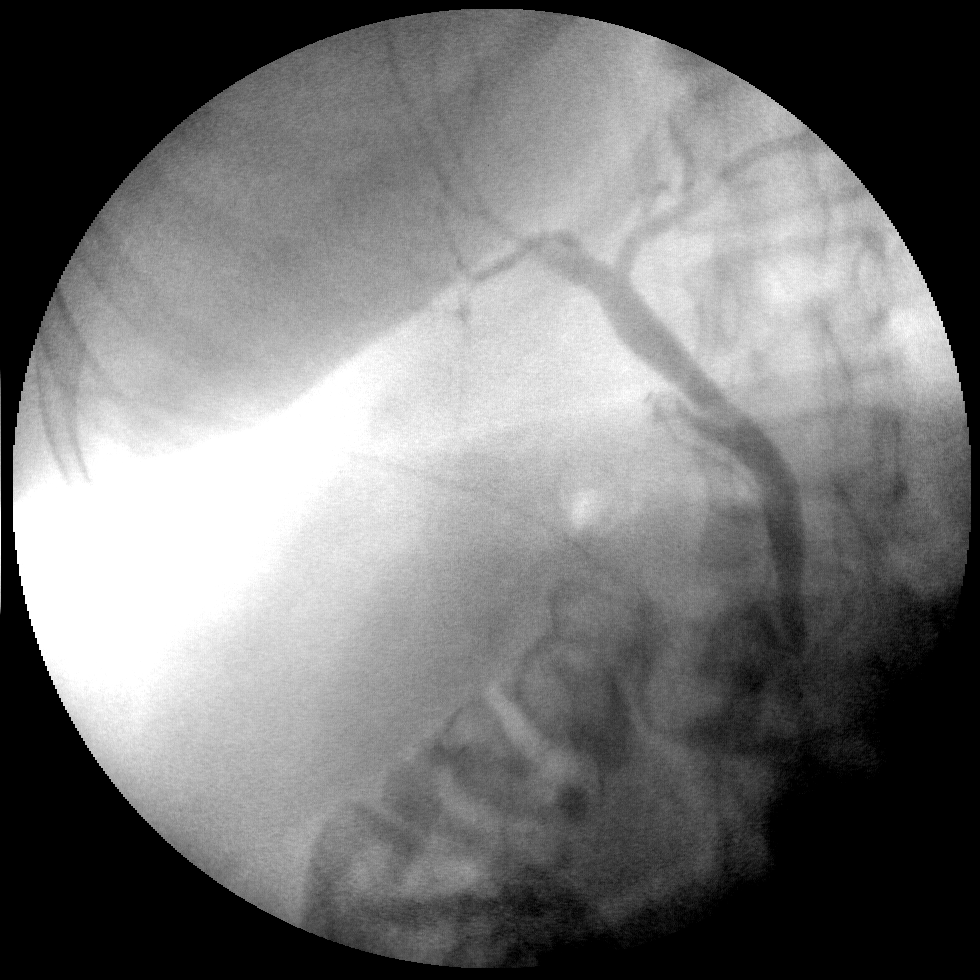

[4 of 4 positions shown; findings below may reference images not displayed]

FINDINGS: Multiple fluoroscopic images of the right upper
quadrant.  Surgical clips at the cystic duct which has cannulated.
Contrast is injected.  Nondilated intra and extrahepatic biliary
tree.  Mild motion artifact.  No CBD filling defect or
extravasation.
IMPRESSION: Mild motion artifact. Negative intraoperative cholangiogram.

## 2012-07-23 ENCOUNTER — Encounter: Payer: Self-pay | Admitting: Dietician

## 2012-07-23 ENCOUNTER — Encounter: Payer: Commercial Indemnity | Attending: Family Medicine | Admitting: Dietician

## 2012-07-23 VITALS — Ht 65.0 in | Wt 348.9 lb

## 2012-07-23 DIAGNOSIS — E119 Type 2 diabetes mellitus without complications: Secondary | ICD-10-CM | POA: Insufficient documentation

## 2012-07-23 DIAGNOSIS — Z713 Dietary counseling and surveillance: Secondary | ICD-10-CM | POA: Insufficient documentation

## 2012-07-23 NOTE — Progress Notes (Signed)
Medical Nutrition Therapy:  Appt start time: 1200 end time:  1300.  Assessment:  Primary concerns today: type II DM. Pt reports with recent HgbA1c of 9.9, but also has lost 15.5 lbs since last appointment with MD on 06/14/12. Her main modality for wt lost has been limiting sweets and cutting out regular soda  MEDICATIONS: see list.   DIETARY INTAKE:  Usual eating pattern includes 3 meals and 0-1 snacks per day.  Everyday foods include belvita breakfast wafers.  Avoided foods include legumes.    24-hr recall:  B ( AM): belvita wafers (4- 1 pack). water Snk ( AM): none  L ( PM): usually eaten out. [employer] pizza for a sub (chicken cheese steak with only lettuce and tomato, mayo?) and salad (lowfat ranch, cheese, tomato, cucs, lettuce) or a very large slice of cheese pizza. Sometimes will skip lunch.  Snk ( PM): usually not, lately been choosing apple slices or other fruit. Usually drinks a flavored drink like slimquick.  D ( PM): chili's is most common when eating out- bacon chix ranch quesadillas. [employer] (where she works every day, eats there [employer])- hot dog and grilled cheese, has been drastically cutting back on dessert items. Fridays at lodge have most variety. Lindie Spruce is another option- egg salad sandwich and yogurt.  Snk ( PM): if up late, she will eat rice rolls from [employer] (10 g carb each) Beverages: now primarily water, used to be large quantity of regular soda (>48 oz per day). Sometimes will add slimquick powder or skinny girl flavoring to water. Occasional chocolate milk, rarely coffee (once per month) with heavy cream and sugar. EtOH only 2-3 drinks per month. Pt claims to have always enjoyed sweets, and has been bringing fewer home and avoiding them lately. She admits to very poor control over sweet foods portions when she has them. She also notes a distaste for many vegetable options, particularly cooked ones.  Usual physical activity: none. Walking for work is main form.  Progress  Towards Goal(s):  In progress.   Nutritional Diagnosis:  NI-5.8.2 Excessive carbohydrate intake As related to type II DM.  As evidenced by HgbA1c 9.9, pt report of previously drinking 48 or more oz of soda each day, eating large quantities of dessert items.    Intervention:  Nutrition counseling provided regarding pathophysiology of type II DM, CHO counting, and importance of physical activity. RD assigned the pt a CHO controlled diet plan, detailed below. RD also touched on what may be causing GI upset after cholecystectomy, in particular long chain saturated fats. Best lean options for proteins and beneficial fats were identified with the pt.  B- 3 CHO, 3 Pro, 2 fat L- 3 CHO, 3-4 Pro, 3 fat, 1+ veg Snack- 1 CHO D- 3 CHO, 3-4 Pro, 3 fat, 1+ veg  Handouts given during visit include:  Diabetes Basics  CHO controlled diet  Good Protein, Good Fat, Good CHO options list  Monitoring/Evaluation:  Dietary intake, exercise, portion control, body weight, updated HgbA1c in 2 month(s).

## 2013-10-30 ENCOUNTER — Other Ambulatory Visit: Payer: Self-pay

## 2013-10-30 DIAGNOSIS — Z1231 Encounter for screening mammogram for malignant neoplasm of breast: Secondary | ICD-10-CM

## 2013-11-14 ENCOUNTER — Ambulatory Visit
Admission: RE | Admit: 2013-11-14 | Discharge: 2013-11-14 | Disposition: A | Discharge status: Home / Self Care | Payer: BC Managed Care – PPO | Source: Ambulatory Visit

## 2013-11-14 DIAGNOSIS — Z1231 Encounter for screening mammogram for malignant neoplasm of breast: Secondary | ICD-10-CM

## 2014-04-23 ENCOUNTER — Encounter (HOSPITAL_COMMUNITY): Payer: Self-pay | Admitting: *Deleted

## 2014-05-06 ENCOUNTER — Other Ambulatory Visit: Payer: Self-pay | Admitting: Gastroenterology

## 2014-05-06 NOTE — Addendum Note (Signed)
Addended by: Arta Silence on: 05/06/2014 05:37 PM   Modules accepted: Orders

## 2014-05-07 ENCOUNTER — Ambulatory Visit (HOSPITAL_COMMUNITY): Payer: BLUE CROSS/BLUE SHIELD | Admitting: Anesthesiology

## 2014-05-07 ENCOUNTER — Ambulatory Visit (HOSPITAL_COMMUNITY)
Admission: RE | Admit: 2014-05-07 | Discharge: 2014-05-07 | Disposition: A | Discharge status: Home / Self Care | Payer: BLUE CROSS/BLUE SHIELD | Source: Ambulatory Visit | Attending: Gastroenterology | Admitting: Gastroenterology

## 2014-05-07 ENCOUNTER — Encounter (HOSPITAL_COMMUNITY): Payer: Self-pay | Admitting: Gastroenterology

## 2014-05-07 ENCOUNTER — Encounter (HOSPITAL_COMMUNITY)
Admission: RE | Disposition: A | Discharge status: Home / Self Care | Payer: Self-pay | Source: Ambulatory Visit | Attending: Gastroenterology

## 2014-05-07 DIAGNOSIS — D123 Benign neoplasm of transverse colon: Secondary | ICD-10-CM | POA: Insufficient documentation

## 2014-05-07 DIAGNOSIS — D125 Benign neoplasm of sigmoid colon: Secondary | ICD-10-CM | POA: Diagnosis not present

## 2014-05-07 DIAGNOSIS — E119 Type 2 diabetes mellitus without complications: Secondary | ICD-10-CM | POA: Insufficient documentation

## 2014-05-07 DIAGNOSIS — K644 Residual hemorrhoidal skin tags: Secondary | ICD-10-CM | POA: Diagnosis not present

## 2014-05-07 DIAGNOSIS — R197 Diarrhea, unspecified: Secondary | ICD-10-CM | POA: Diagnosis present

## 2014-05-07 DIAGNOSIS — F17200 Nicotine dependence, unspecified, uncomplicated: Secondary | ICD-10-CM | POA: Insufficient documentation

## 2014-05-07 DIAGNOSIS — Z6841 Body Mass Index (BMI) 40.0 and over, adult: Secondary | ICD-10-CM | POA: Insufficient documentation

## 2014-05-07 DIAGNOSIS — K921 Melena: Secondary | ICD-10-CM | POA: Diagnosis present

## 2014-05-07 HISTORY — PX: COLONOSCOPY WITH PROPOFOL: SHX5780

## 2014-05-07 HISTORY — DX: Other complications of anesthesia, initial encounter: T88.59XA

## 2014-05-07 HISTORY — DX: Adverse effect of unspecified anesthetic, initial encounter: T41.45XA

## 2014-05-07 LAB — GLUCOSE, CAPILLARY: Glucose-Capillary: 155 mg/dL — ABNORMAL HIGH (ref 70–99)

## 2014-05-07 SURGERY — COLONOSCOPY WITH PROPOFOL
Anesthesia: Monitor Anesthesia Care

## 2014-05-07 MED ORDER — LACTATED RINGERS IV SOLN
INTRAVENOUS | Status: DC
  Administered 2014-05-07: 1000 mL via INTRAVENOUS

## 2014-05-07 MED ORDER — PROPOFOL 10 MG/ML IV BOLUS
INTRAVENOUS | Status: AC
  Filled 2014-05-07: qty 20

## 2014-05-07 MED ORDER — SODIUM CHLORIDE 0.9 % IV SOLN: INTRAVENOUS | Status: DC

## 2014-05-07 MED ORDER — LACTATED RINGERS IV SOLN
INTRAVENOUS | Status: DC
  Administered 2014-05-07: 09:00:00 via INTRAVENOUS

## 2014-05-07 MED ORDER — PROPOFOL INFUSION 10 MG/ML OPTIME
INTRAVENOUS | Status: DC | PRN
  Administered 2014-05-07: 300 ug/kg/min via INTRAVENOUS

## 2014-05-07 SURGICAL SUPPLY — 22 items

## 2014-05-07 NOTE — Anesthesia Postprocedure Evaluation (Signed)
  Anesthesia Post-op Note  Patient: [name] [name]  Procedure(s) Performed: Procedure(s) (LRB): COLONOSCOPY WITH PROPOFOL (N/A)  Patient Location: PACU  Anesthesia Type: MAC  Level of Consciousness: awake and alert   Airway and Oxygen Therapy: Patient Spontanous Breathing  Post-op Pain: mild  Post-op Assessment: Post-op Vital signs reviewed, Patient's Cardiovascular Status Stable, Respiratory Function Stable, Patent Airway and No signs of Nausea or vomiting  Last Vitals:  Filed Vitals:   05/07/14 1020  BP: 124/66  Pulse: 99  Temp: 36.6 C  Resp: 23    Post-op Vital Signs: stable   Complications: No apparent anesthesia complications

## 2014-05-07 NOTE — Transfer of Care (Signed)
Immediate Anesthesia Transfer of Care Note  Patient: [name] [name]  Procedure(s) Performed: Procedure(s): COLONOSCOPY WITH PROPOFOL (N/A)  Patient Location: PACU and Endoscopy Unit  Anesthesia Type:MAC  Level of Consciousness: awake, alert , oriented and patient cooperative  Airway & Oxygen Therapy: Patient Spontanous Breathing and Patient connected to face mask oxygen  Post-op Assessment: Report given to RN and Post -op Vital signs reviewed and stable  Post vital signs: Reviewed and stable  Last Vitals:  Filed Vitals:   05/07/14 0746  BP: 158/69  Pulse: 89  Temp: 36.7 C  Resp: 17    Complications: No apparent anesthesia complications

## 2014-05-07 NOTE — H&P (Signed)
Patient interval history reviewed.  Patient examined again.  There has been no change from documented H/P dated 04/15/14 (scanned into chart from our office) except as documented above.  Assessment:  1.  Blood in stool. 2.  Diarrhea.  Improving with probiotics.  Plan:  1.  Colonoscopy. 2.  Risks (bleeding, infection, bowel perforation that could require surgery, sedation-related changes in cardiopulmonary systems), benefits (identification and possible treatment of source of symptoms, exclusion of certain causes of symptoms), and alternatives (watchful waiting, radiographic imaging studies, empiric medical treatment) of colonoscopy were explained to patient/family in detail and patient wishes to proceed.

## 2014-05-07 NOTE — Op Note (Signed)
[facility] [place], [address]   COLONOSCOPY PROCEDURE REPORT  PATIENT: [name] [name], [name] [name]  MR#: [id number] BIRTHDATE: 03/22/1970 , 52  yrs. old GENDER: female ENDOSCOPIST: Arta Silence, MD REFERRED [id number] Drema Dallas, M.D. PROCEDURE DATE:  05/18/14 PROCEDURE:   Colonoscopy with cold biopsy polypectomy, Colonoscopy with biopsy, Colonoscopy with snare polypectomy, and Colonoscopy, diagnostic ASA CLASS:   Class III INDICATIONS:diarrhea, blood in stool. MEDICATIONS: Monitored anesthesia care  DESCRIPTION OF PROCEDURE:   After the risks benefits and alternatives of the procedure were thoroughly explained, informed consent was obtained.  revealed external hemorrhoids.   The adult colonoscope was introduced through the anus and advanced to the cecum, which was identified by both the appendix and ileocecal valve. No adverse events experienced.   The quality of the prep was adequate  The instrument was then slowly withdrawn as the colon was fully examined. Estimated blood loss is zero unless otherwise noted in this procedure report.    Findings:  External hemorrhoids, otherwise normal digital rectal exam.  Prep quality adequate.  Mucosa of colon looked normal; random biopsies from left and right colon were taken to assess for microscopic colitis.  49mm sigmoid colon polyp removed with cold biopsy forceps.  18mm transverse colon polyp removed with snare cautery.  No other polyps, masses, vascular ectasias, or inflammatory changes were seen.  Retroflexed view of rectum was normal.             Withdrawal time was over 10 minutes     .  The scope was withdrawn and the procedure completed.  COMPLICATIONS: None  ENDOSCOPIC IMPRESSION:     As above.  Suspect intermittent bleeding is due to external hemorrhoids.  No explanation for diarrhea with recent labs, biopsies pending, symptoms improving with probiotics (Restora)  RECOMMENDATIONS:     1.  Watch for  potential complications of procedure. 2.  Await biopsy + polypectomy results. 3.  Repeat colonoscopy in 5-10 years, pending pathology results. 4.  Follow-up with [facility] in 6-8 weeks.  eSigned:  Arta Silence, MD 2014-05-18 10:26 AM   cc:  CPT CODES: ICD CODES:  The ICD and CPT codes recommended by this software are interpretations from the data that the clinical staff has captured with the software.  The verification of the translation of this report to the ICD and CPT codes and modifiers is the sole responsibility of the health care institution and practicing physician where this report was generated.  [employer] will not be held responsible for the validity of the ICD and CPT codes included on this report.  AMA assumes no liability for data contained or not contained herein. CPT is a Designer, television/film set of the [employer].

## 2014-05-07 NOTE — Anesthesia Preprocedure Evaluation (Signed)
Anesthesia Evaluation  Patient identified by MRN, date of birth, ID band Patient awake    Reviewed: Allergy & Precautions, NPO status , Patient's Chart, lab work & pertinent test results  Airway Mallampati: II  TM Distance: >3 FB Neck ROM: Full    Dental no notable dental hx.    Pulmonary Current Smoker,  breath sounds clear to auscultation  Pulmonary exam normal       Cardiovascular negative cardio ROS  Rhythm:Regular Rate:Normal     Neuro/Psych negative neurological ROS  negative psych ROS   GI/Hepatic negative GI ROS, Neg liver ROS,   Endo/Other  diabetes, Type 2, Oral Hypoglycemic AgentsMorbid obesity  Renal/GU negative Renal ROS  negative genitourinary   Musculoskeletal negative musculoskeletal ROS (+)   Abdominal   Peds negative pediatric ROS (+)  Hematology negative hematology ROS (+)   Anesthesia Other Findings   Reproductive/Obstetrics negative OB ROS                             Anesthesia Physical Anesthesia Plan  ASA: II  Anesthesia Plan: MAC   Post-op Pain Management:    Induction:   Airway Management Planned: Simple Face Mask  Additional Equipment:   Intra-op Plan:   Post-operative Plan:   Informed Consent: I have reviewed the patients History and Physical, chart, labs and discussed the procedure including the risks, benefits and alternatives for the proposed anesthesia with the patient or authorized representative who has indicated his/her understanding and acceptance.   Dental advisory given  Plan Discussed with: CRNA  Anesthesia Plan Comments:         Anesthesia Quick Evaluation

## 2014-05-07 NOTE — Discharge Instructions (Signed)
Colonoscopy, Care After °These instructions give you information on caring for yourself after your procedure. Your doctor may also give you more specific instructions. Call your doctor if you have any problems or questions after your procedure. °HOME CARE °· Do not drive for 24 hours. °· Do not sign important papers or use machinery for 24 hours. °· You may shower. °· You may go back to your usual activities, but go slower for the first 24 hours. °· Take rest breaks often during the first 24 hours. °· Walk around or use warm packs on your belly (abdomen) if you have belly cramping or gas. °· Drink enough fluids to keep your pee (urine) clear or pale yellow. °· Resume your normal diet. Avoid heavy or fried foods. °· Avoid drinking alcohol for 24 hours or as told by your doctor. °· Only take medicines as told by your doctor. °If a tissue sample (biopsy) was taken during the procedure:  °· Do not take aspirin or blood thinners for 7 days, or as told by your doctor. °· Do not drink alcohol for 7 days, or as told by your doctor. °· Eat soft foods for the first 24 hours. °GET HELP IF: °You still have a small amount of blood in your poop (stool) 2-3 days after the procedure. °GET HELP RIGHT AWAY IF: °· You have more than a small amount of blood in your poop. °· You see clumps of tissue (blood clots) in your poop. °· Your belly is puffy (swollen). °· You feel sick to your stomach (nauseous) or throw up (vomit). °· You have a fever. °· You have belly pain that gets worse and medicine does not help. °MAKE SURE YOU: °· Understand these instructions. °· Will watch your condition. °· Will get help right away if you are not doing well or get worse. °Document Released: 01/29/2010 Document Revised: 01/01/2013 Document Reviewed: 09/03/2012 °ExitCare® Patient Information ©2015 [employer]. This information is not intended to replace advice given to you by your health care provider. Make sure you discuss any questions you have with  your health care provider. ° °

## 2014-05-08 ENCOUNTER — Encounter (HOSPITAL_COMMUNITY): Payer: Self-pay | Admitting: Gastroenterology

## 2014-10-03 ENCOUNTER — Ambulatory Visit: Payer: BLUE CROSS/BLUE SHIELD | Admitting: Cardiovascular Disease

## 2015-03-19 ENCOUNTER — Other Ambulatory Visit: Payer: Self-pay

## 2015-03-19 DIAGNOSIS — Z1231 Encounter for screening mammogram for malignant neoplasm of breast: Secondary | ICD-10-CM

## 2015-04-02 ENCOUNTER — Ambulatory Visit
Admission: RE | Admit: 2015-04-02 | Discharge: 2015-04-02 | Disposition: A | Discharge status: Home / Self Care | Payer: BLUE CROSS/BLUE SHIELD | Source: Ambulatory Visit

## 2015-04-02 DIAGNOSIS — Z1231 Encounter for screening mammogram for malignant neoplasm of breast: Secondary | ICD-10-CM

## 2015-05-14 DIAGNOSIS — E119 Type 2 diabetes mellitus without complications: Secondary | ICD-10-CM | POA: Diagnosis not present

## 2015-05-14 DIAGNOSIS — E785 Hyperlipidemia, unspecified: Secondary | ICD-10-CM | POA: Diagnosis not present

## 2015-05-21 DIAGNOSIS — E559 Vitamin D deficiency, unspecified: Secondary | ICD-10-CM | POA: Diagnosis not present

## 2015-05-21 DIAGNOSIS — E119 Type 2 diabetes mellitus without complications: Secondary | ICD-10-CM | POA: Diagnosis not present

## 2015-05-21 DIAGNOSIS — E8881 Metabolic syndrome: Secondary | ICD-10-CM | POA: Diagnosis not present

## 2015-05-28 DIAGNOSIS — E538 Deficiency of other specified B group vitamins: Secondary | ICD-10-CM | POA: Diagnosis not present

## 2015-05-28 DIAGNOSIS — E8881 Metabolic syndrome: Secondary | ICD-10-CM | POA: Diagnosis not present

## 2015-05-28 DIAGNOSIS — E119 Type 2 diabetes mellitus without complications: Secondary | ICD-10-CM | POA: Diagnosis not present

## 2015-06-04 DIAGNOSIS — E8881 Metabolic syndrome: Secondary | ICD-10-CM | POA: Diagnosis not present

## 2015-06-04 DIAGNOSIS — E559 Vitamin D deficiency, unspecified: Secondary | ICD-10-CM | POA: Diagnosis not present

## 2015-06-04 DIAGNOSIS — E119 Type 2 diabetes mellitus without complications: Secondary | ICD-10-CM | POA: Diagnosis not present

## 2015-06-11 DIAGNOSIS — E119 Type 2 diabetes mellitus without complications: Secondary | ICD-10-CM | POA: Diagnosis not present

## 2015-06-11 DIAGNOSIS — E8881 Metabolic syndrome: Secondary | ICD-10-CM | POA: Diagnosis not present

## 2015-06-18 DIAGNOSIS — E119 Type 2 diabetes mellitus without complications: Secondary | ICD-10-CM | POA: Diagnosis not present

## 2015-06-18 DIAGNOSIS — E8881 Metabolic syndrome: Secondary | ICD-10-CM | POA: Diagnosis not present

## 2015-06-18 DIAGNOSIS — E559 Vitamin D deficiency, unspecified: Secondary | ICD-10-CM | POA: Diagnosis not present

## 2015-06-25 DIAGNOSIS — E8881 Metabolic syndrome: Secondary | ICD-10-CM | POA: Diagnosis not present

## 2015-06-25 DIAGNOSIS — E785 Hyperlipidemia, unspecified: Secondary | ICD-10-CM | POA: Diagnosis not present

## 2015-07-02 DIAGNOSIS — E119 Type 2 diabetes mellitus without complications: Secondary | ICD-10-CM | POA: Diagnosis not present

## 2015-07-02 DIAGNOSIS — E559 Vitamin D deficiency, unspecified: Secondary | ICD-10-CM | POA: Diagnosis not present

## 2015-07-02 DIAGNOSIS — E8881 Metabolic syndrome: Secondary | ICD-10-CM | POA: Diagnosis not present

## 2015-07-09 DIAGNOSIS — E119 Type 2 diabetes mellitus without complications: Secondary | ICD-10-CM | POA: Diagnosis not present

## 2015-07-09 DIAGNOSIS — E8881 Metabolic syndrome: Secondary | ICD-10-CM | POA: Diagnosis not present

## 2015-07-09 DIAGNOSIS — E559 Vitamin D deficiency, unspecified: Secondary | ICD-10-CM | POA: Diagnosis not present

## 2015-07-16 DIAGNOSIS — E119 Type 2 diabetes mellitus without complications: Secondary | ICD-10-CM | POA: Diagnosis not present

## 2015-07-16 DIAGNOSIS — R635 Abnormal weight gain: Secondary | ICD-10-CM | POA: Diagnosis not present

## 2015-07-16 DIAGNOSIS — E785 Hyperlipidemia, unspecified: Secondary | ICD-10-CM | POA: Diagnosis not present

## 2015-07-23 DIAGNOSIS — E119 Type 2 diabetes mellitus without complications: Secondary | ICD-10-CM | POA: Diagnosis not present

## 2015-07-23 DIAGNOSIS — E8881 Metabolic syndrome: Secondary | ICD-10-CM | POA: Diagnosis not present

## 2015-07-23 DIAGNOSIS — E785 Hyperlipidemia, unspecified: Secondary | ICD-10-CM | POA: Diagnosis not present

## 2015-07-30 DIAGNOSIS — E559 Vitamin D deficiency, unspecified: Secondary | ICD-10-CM | POA: Diagnosis not present

## 2015-07-30 DIAGNOSIS — E8881 Metabolic syndrome: Secondary | ICD-10-CM | POA: Diagnosis not present

## 2015-07-30 DIAGNOSIS — E119 Type 2 diabetes mellitus without complications: Secondary | ICD-10-CM | POA: Diagnosis not present

## 2015-08-06 DIAGNOSIS — R790 Abnormal level of blood mineral: Secondary | ICD-10-CM | POA: Diagnosis not present

## 2015-08-06 DIAGNOSIS — E119 Type 2 diabetes mellitus without complications: Secondary | ICD-10-CM | POA: Diagnosis not present

## 2015-08-06 DIAGNOSIS — E8881 Metabolic syndrome: Secondary | ICD-10-CM | POA: Diagnosis not present

## 2015-08-20 DIAGNOSIS — E785 Hyperlipidemia, unspecified: Secondary | ICD-10-CM | POA: Diagnosis not present

## 2015-08-20 DIAGNOSIS — E8881 Metabolic syndrome: Secondary | ICD-10-CM | POA: Diagnosis not present

## 2015-09-03 DIAGNOSIS — E559 Vitamin D deficiency, unspecified: Secondary | ICD-10-CM | POA: Diagnosis not present

## 2015-09-03 DIAGNOSIS — E785 Hyperlipidemia, unspecified: Secondary | ICD-10-CM | POA: Diagnosis not present

## 2015-09-17 DIAGNOSIS — E119 Type 2 diabetes mellitus without complications: Secondary | ICD-10-CM | POA: Diagnosis not present

## 2015-09-17 DIAGNOSIS — R790 Abnormal level of blood mineral: Secondary | ICD-10-CM | POA: Diagnosis not present

## 2015-09-17 DIAGNOSIS — E559 Vitamin D deficiency, unspecified: Secondary | ICD-10-CM | POA: Diagnosis not present

## 2015-10-01 DIAGNOSIS — E8881 Metabolic syndrome: Secondary | ICD-10-CM | POA: Diagnosis not present

## 2015-10-01 DIAGNOSIS — E119 Type 2 diabetes mellitus without complications: Secondary | ICD-10-CM | POA: Diagnosis not present

## 2015-10-01 DIAGNOSIS — E559 Vitamin D deficiency, unspecified: Secondary | ICD-10-CM | POA: Diagnosis not present

## 2015-10-15 DIAGNOSIS — E119 Type 2 diabetes mellitus without complications: Secondary | ICD-10-CM | POA: Diagnosis not present

## 2015-10-15 DIAGNOSIS — E8881 Metabolic syndrome: Secondary | ICD-10-CM | POA: Diagnosis not present

## 2015-10-29 DIAGNOSIS — R6889 Other general symptoms and signs: Secondary | ICD-10-CM | POA: Diagnosis not present

## 2015-10-29 DIAGNOSIS — E8881 Metabolic syndrome: Secondary | ICD-10-CM | POA: Diagnosis not present

## 2015-10-29 DIAGNOSIS — E119 Type 2 diabetes mellitus without complications: Secondary | ICD-10-CM | POA: Diagnosis not present

## 2015-10-29 DIAGNOSIS — R635 Abnormal weight gain: Secondary | ICD-10-CM | POA: Diagnosis not present

## 2015-11-12 DIAGNOSIS — E785 Hyperlipidemia, unspecified: Secondary | ICD-10-CM | POA: Diagnosis not present

## 2015-11-12 DIAGNOSIS — E8881 Metabolic syndrome: Secondary | ICD-10-CM | POA: Diagnosis not present

## 2015-11-26 DIAGNOSIS — E119 Type 2 diabetes mellitus without complications: Secondary | ICD-10-CM | POA: Diagnosis not present

## 2015-11-26 DIAGNOSIS — E785 Hyperlipidemia, unspecified: Secondary | ICD-10-CM | POA: Diagnosis not present

## 2015-12-17 DIAGNOSIS — E8881 Metabolic syndrome: Secondary | ICD-10-CM | POA: Diagnosis not present

## 2015-12-17 DIAGNOSIS — E559 Vitamin D deficiency, unspecified: Secondary | ICD-10-CM | POA: Diagnosis not present

## 2015-12-17 DIAGNOSIS — E119 Type 2 diabetes mellitus without complications: Secondary | ICD-10-CM | POA: Diagnosis not present

## 2016-01-21 DIAGNOSIS — E559 Vitamin D deficiency, unspecified: Secondary | ICD-10-CM | POA: Diagnosis not present

## 2016-01-21 DIAGNOSIS — E8881 Metabolic syndrome: Secondary | ICD-10-CM | POA: Diagnosis not present

## 2016-01-21 DIAGNOSIS — E119 Type 2 diabetes mellitus without complications: Secondary | ICD-10-CM | POA: Diagnosis not present

## 2016-02-04 DIAGNOSIS — E119 Type 2 diabetes mellitus without complications: Secondary | ICD-10-CM | POA: Diagnosis not present

## 2016-02-04 DIAGNOSIS — E8881 Metabolic syndrome: Secondary | ICD-10-CM | POA: Diagnosis not present

## 2016-02-04 DIAGNOSIS — E559 Vitamin D deficiency, unspecified: Secondary | ICD-10-CM | POA: Diagnosis not present

## 2016-02-25 DIAGNOSIS — E785 Hyperlipidemia, unspecified: Secondary | ICD-10-CM | POA: Diagnosis not present

## 2016-02-25 DIAGNOSIS — E119 Type 2 diabetes mellitus without complications: Secondary | ICD-10-CM | POA: Diagnosis not present

## 2016-03-05 DIAGNOSIS — L02232 Carbuncle of back [any part, except buttock]: Secondary | ICD-10-CM | POA: Diagnosis not present

## 2016-03-07 DIAGNOSIS — L02232 Carbuncle of back [any part, except buttock]: Secondary | ICD-10-CM | POA: Diagnosis not present

## 2016-03-08 ENCOUNTER — Other Ambulatory Visit: Payer: Self-pay | Admitting: Obstetrics & Gynecology

## 2016-03-08 DIAGNOSIS — Z8742 Personal history of other diseases of the female genital tract: Secondary | ICD-10-CM | POA: Diagnosis not present

## 2016-03-08 DIAGNOSIS — Z3202 Encounter for pregnancy test, result negative: Secondary | ICD-10-CM | POA: Diagnosis not present

## 2016-03-08 DIAGNOSIS — N939 Abnormal uterine and vaginal bleeding, unspecified: Secondary | ICD-10-CM | POA: Diagnosis not present

## 2016-03-08 DIAGNOSIS — N898 Other specified noninflammatory disorders of vagina: Secondary | ICD-10-CM | POA: Diagnosis not present

## 2016-03-08 DIAGNOSIS — N840 Polyp of corpus uteri: Secondary | ICD-10-CM | POA: Diagnosis not present

## 2016-03-22 DIAGNOSIS — N939 Abnormal uterine and vaginal bleeding, unspecified: Secondary | ICD-10-CM | POA: Diagnosis not present

## 2016-05-19 DIAGNOSIS — E559 Vitamin D deficiency, unspecified: Secondary | ICD-10-CM | POA: Diagnosis not present

## 2016-05-19 DIAGNOSIS — E118 Type 2 diabetes mellitus with unspecified complications: Secondary | ICD-10-CM | POA: Diagnosis not present

## 2016-05-19 DIAGNOSIS — E785 Hyperlipidemia, unspecified: Secondary | ICD-10-CM | POA: Diagnosis not present

## 2016-05-19 DIAGNOSIS — Z0000 Encounter for general adult medical examination without abnormal findings: Secondary | ICD-10-CM | POA: Diagnosis not present

## 2016-05-19 DIAGNOSIS — L659 Nonscarring hair loss, unspecified: Secondary | ICD-10-CM | POA: Diagnosis not present

## 2016-05-19 DIAGNOSIS — F410 Panic disorder [episodic paroxysmal anxiety] without agoraphobia: Secondary | ICD-10-CM | POA: Diagnosis not present

## 2016-05-19 DIAGNOSIS — J9801 Acute bronchospasm: Secondary | ICD-10-CM | POA: Diagnosis not present

## 2016-06-24 DIAGNOSIS — N839 Noninflammatory disorder of ovary, fallopian tube and broad ligament, unspecified: Secondary | ICD-10-CM | POA: Diagnosis not present

## 2016-06-24 DIAGNOSIS — R102 Pelvic and perineal pain: Secondary | ICD-10-CM | POA: Diagnosis not present

## 2016-06-24 DIAGNOSIS — N939 Abnormal uterine and vaginal bleeding, unspecified: Secondary | ICD-10-CM | POA: Diagnosis not present

## 2017-03-16 ENCOUNTER — Other Ambulatory Visit: Payer: Self-pay | Admitting: Obstetrics & Gynecology

## 2017-03-16 DIAGNOSIS — Z1231 Encounter for screening mammogram for malignant neoplasm of breast: Secondary | ICD-10-CM

## 2017-04-05 ENCOUNTER — Ambulatory Visit
Admission: RE | Admit: 2017-04-05 | Discharge: 2017-04-05 | Disposition: A | Discharge status: Home / Self Care | Payer: BLUE CROSS/BLUE SHIELD | Source: Ambulatory Visit | Attending: Obstetrics & Gynecology | Admitting: Obstetrics & Gynecology

## 2017-04-05 DIAGNOSIS — Z1231 Encounter for screening mammogram for malignant neoplasm of breast: Secondary | ICD-10-CM

## 2017-04-06 ENCOUNTER — Other Ambulatory Visit: Payer: Self-pay | Admitting: Obstetrics & Gynecology

## 2017-04-06 DIAGNOSIS — N644 Mastodynia: Secondary | ICD-10-CM

## 2017-04-12 ENCOUNTER — Ambulatory Visit
Admission: RE | Admit: 2017-04-12 | Discharge: 2017-04-12 | Disposition: A | Discharge status: Home / Self Care | Payer: BLUE CROSS/BLUE SHIELD | Source: Ambulatory Visit | Attending: Obstetrics & Gynecology | Admitting: Obstetrics & Gynecology

## 2017-04-12 ENCOUNTER — Ambulatory Visit: Payer: BLUE CROSS/BLUE SHIELD

## 2017-04-12 DIAGNOSIS — N644 Mastodynia: Secondary | ICD-10-CM

## 2017-04-12 DIAGNOSIS — R928 Other abnormal and inconclusive findings on diagnostic imaging of breast: Secondary | ICD-10-CM | POA: Diagnosis not present

## 2017-04-20 DIAGNOSIS — J029 Acute pharyngitis, unspecified: Secondary | ICD-10-CM | POA: Diagnosis not present

## 2017-04-20 DIAGNOSIS — R05 Cough: Secondary | ICD-10-CM | POA: Diagnosis not present

## 2017-04-24 DIAGNOSIS — R05 Cough: Secondary | ICD-10-CM | POA: Diagnosis not present

## 2017-04-24 DIAGNOSIS — J029 Acute pharyngitis, unspecified: Secondary | ICD-10-CM | POA: Diagnosis not present

## 2017-04-24 DIAGNOSIS — H109 Unspecified conjunctivitis: Secondary | ICD-10-CM | POA: Diagnosis not present

## 2017-05-04 DIAGNOSIS — N920 Excessive and frequent menstruation with regular cycle: Secondary | ICD-10-CM | POA: Diagnosis not present

## 2017-05-04 DIAGNOSIS — Z01411 Encounter for gynecological examination (general) (routine) with abnormal findings: Secondary | ICD-10-CM | POA: Diagnosis not present

## 2017-06-15 DIAGNOSIS — N920 Excessive and frequent menstruation with regular cycle: Secondary | ICD-10-CM | POA: Diagnosis not present

## 2017-10-25 DIAGNOSIS — J9801 Acute bronchospasm: Secondary | ICD-10-CM | POA: Diagnosis not present

## 2017-10-25 DIAGNOSIS — E785 Hyperlipidemia, unspecified: Secondary | ICD-10-CM | POA: Diagnosis not present

## 2017-10-25 DIAGNOSIS — E118 Type 2 diabetes mellitus with unspecified complications: Secondary | ICD-10-CM | POA: Diagnosis not present

## 2017-10-25 DIAGNOSIS — E559 Vitamin D deficiency, unspecified: Secondary | ICD-10-CM | POA: Diagnosis not present

## 2018-07-19 DIAGNOSIS — Z01411 Encounter for gynecological examination (general) (routine) with abnormal findings: Secondary | ICD-10-CM | POA: Diagnosis not present

## 2018-07-19 DIAGNOSIS — N920 Excessive and frequent menstruation with regular cycle: Secondary | ICD-10-CM | POA: Diagnosis not present

## 2019-04-02 ENCOUNTER — Other Ambulatory Visit: Payer: Self-pay | Admitting: Family Medicine

## 2019-04-02 ENCOUNTER — Ambulatory Visit
Admission: RE | Admit: 2019-04-02 | Discharge: 2019-04-02 | Disposition: A | Discharge status: Home / Self Care | Payer: BLUE CROSS/BLUE SHIELD | Source: Ambulatory Visit | Attending: Family Medicine | Admitting: Family Medicine

## 2019-04-02 DIAGNOSIS — I82412 Acute embolism and thrombosis of left femoral vein: Secondary | ICD-10-CM | POA: Diagnosis not present

## 2019-04-02 DIAGNOSIS — Z720 Tobacco use: Secondary | ICD-10-CM | POA: Diagnosis not present

## 2019-04-02 DIAGNOSIS — E785 Hyperlipidemia, unspecified: Secondary | ICD-10-CM | POA: Diagnosis not present

## 2019-04-02 DIAGNOSIS — E559 Vitamin D deficiency, unspecified: Secondary | ICD-10-CM | POA: Diagnosis not present

## 2019-04-02 DIAGNOSIS — M7989 Other specified soft tissue disorders: Secondary | ICD-10-CM

## 2019-04-02 DIAGNOSIS — E118 Type 2 diabetes mellitus with unspecified complications: Secondary | ICD-10-CM | POA: Diagnosis not present

## 2019-04-02 DIAGNOSIS — Z5181 Encounter for therapeutic drug level monitoring: Secondary | ICD-10-CM | POA: Diagnosis not present

## 2019-04-05 ENCOUNTER — Telehealth (HOSPITAL_COMMUNITY): Payer: Self-pay

## 2019-04-05 NOTE — Telephone Encounter (Signed)

## 2019-04-09 ENCOUNTER — Other Ambulatory Visit: Payer: Self-pay

## 2019-04-09 ENCOUNTER — Encounter: Payer: Self-pay | Admitting: Vascular Surgery

## 2019-04-09 ENCOUNTER — Ambulatory Visit: Payer: BC Managed Care – PPO | Admitting: Vascular Surgery

## 2019-04-09 VITALS — BP 130/90 | HR 97 | Temp 98.0°F | Resp 18 | Ht 64.0 in | Wt 306.4 lb

## 2019-04-09 DIAGNOSIS — I82412 Acute embolism and thrombosis of left femoral vein: Secondary | ICD-10-CM

## 2019-04-09 NOTE — Progress Notes (Signed)
[facility] [place]  Patient name: [name] [name] MRN: [id number] DOB: Feb 25, 1970 Sex: female  REASON FOR CONSULT: Evaluation left leg DVT  HPI: [name] [name] is a 49 y.o. female, who is here for evaluation of acute DVT.  She reports that on 03/23/2018 when she began having pain and swelling in her left leg.  She was evaluated on 323 and sent for duplex which was positive for extensive DVT in her left femoral vein, popliteal vein and posterior tibial vein.  She was started on Xarelto.  She denies any prior history of DVT.  She reports that she has been very sedentary due to caring for her husband who has leukemia.  She is morbidly obese with a BMI of 52.  Past Medical History:  Diagnosis Date  . Abdominal pain   . Bronchitis   . Complication of anesthesia    hard to wake up  . Diabetes mellitus   . Nipple discharge   . Pneumonia     Family History  Adopted: Yes    SOCIAL HISTORY: Social History   Socioeconomic History  . Marital status: Married    Spouse name: Not on file  . Number of children: Not on file  . Years of education: Not on file  . Highest education level: Not on file  Occupational History  . Not on file  Tobacco Use  . Smoking status: Current Every Day Smoker    Packs/day: 0.50    Types: Cigarettes  . Smokeless tobacco: Never Used  Substance and Sexual Activity  . Alcohol use: Yes    Alcohol/week: 2.0 standard drinks    Types: 2 Standard drinks or equivalent per week  . Drug use: No  . Sexual activity: Not on file  Other Topics Concern  . Not on file  Social History Narrative  . Not on file   Social Determinants of Health   Financial Resource Strain:   . Difficulty of Paying Living Expenses:   Food Insecurity:   . Worried About Charity fundraiser in the Last Year:   . Arboriculturist in the Last Year:   Transportation Needs:   . Film/video editor (Medical):   Marland Kitchen Lack of  Transportation (Non-Medical):   Physical Activity:   . Days of Exercise per Week:   . Minutes of Exercise per Session:   Stress:   . Feeling of Stress :   Social Connections:   . Frequency of Communication with Friends and Family:   . Frequency of Social Gatherings with Friends and Family:   . Attends Religious Services:   . Active Member of Clubs or Organizations:   . Attends Archivist Meetings:   Marland Kitchen Marital Status:   Intimate Partner Violence:   . Fear of Current or Ex-Partner:   . Emotionally Abused:   Marland Kitchen Physically Abused:   . Sexually Abused:     Allergies  Allergen Reactions  . Musk     Bronchial spasms  . Other     Fresh cut grass and bleach -bronchial spasms    Current Outpatient Medications  Medication Sig Dispense Refill  . albuterol (PROVENTIL HFA;VENTOLIN HFA) 108 (90 BASE) MCG/ACT inhaler Inhale 1 puff into the lungs every 6 (six) hours as needed for wheezing or shortness of breath.    . ALPRAZolam (XANAX) 0.25 MG tablet Take 0.25 mg by mouth 2 (two) times daily as needed.    Marland Kitchen atorvastatin (LIPITOR) 20 MG tablet  Take 20 mg by mouth daily.    . cholecalciferol (VITAMIN D3) 25 MCG (1000 UNIT) tablet Take 2,000 Units by mouth daily. Takes 2 tablets daily.    Marland Kitchen lisinopril (PRINIVIL,ZESTRIL) 2.5 MG tablet Take 2.5 mg by mouth at bedtime.    Marland Kitchen loperamide (IMODIUM) 2 MG capsule Take 2 mg by mouth 4 (four) times daily as needed for diarrhea or loose stools.    . metFORMIN (GLUCOPHAGE-XR) 500 MG 24 hr tablet Take 500 mg by mouth 2 (two) times daily.    Marland Kitchen omeprazole (PRILOSEC) 20 MG capsule Take 20 mg by mouth daily.    . Probiotic Product (RESTORA PO) Take 1 tablet by mouth daily.    . tranexamic acid (LYSTEDA) 650 MG TABS tablet Take 1,300 mg by mouth 3 (three) times daily.    Alveda Reasons 15 MG TABS tablet Take 15 mg by mouth 2 (two) times daily.    Marland Kitchen ibuprofen (ADVIL,MOTRIN) 200 MG tablet Take 800 mg by mouth every 6 (six) hours as needed (Pain).     No  current facility-administered medications for this visit.    REVIEW OF SYSTEMS:  [X]  denotes positive finding, [ ]  denotes negative finding Cardiac  Comments:  Chest pain or chest pressure: x   Shortness of breath upon exertion:    Short of breath when lying flat:    Irregular heart rhythm:        Vascular    Pain in calf, thigh, or hip brought on by ambulation:    Pain in feet at night that wakes you up from your sleep:     Blood clot in your veins:    Leg swelling:  x       Pulmonary    Oxygen at home:    Productive cough:     Wheezing:         Neurologic    Sudden weakness in arms or legs:     Sudden numbness in arms or legs:     Sudden onset of difficulty speaking or slurred speech:    Temporary loss of vision in one eye:     Problems with dizziness:         Gastrointestinal    Blood in stool:     Vomited blood:         Genitourinary    Burning when urinating:     Blood in urine:        Psychiatric    Major depression:         Hematologic    Bleeding problems:    Problems with blood clotting too easily:        Skin    Rashes or ulcers:        Constitutional    Fever or chills: x     PHYSICAL EXAM: Vitals:   04/09/19 1420  BP: 130/90  Pulse: 97  Resp: 18  Temp: 98 F (36.7 C)  TempSrc: Temporal  SpO2: 99%  Weight: (!) 306 lb 6.4 oz (139 kg)  Height: 5\' 4"  (1.626 m)    GENERAL: The patient is a well-nourished female, in no acute distress. The vital signs are documented above. CARDIOVASCULAR: 2+ dorsalis pedis pulses bilaterally.  Moderate calf tenderness on the left. PULMONARY: There is good air exchange  ABDOMEN: Soft and non-tender  MUSCULOSKELETAL: There are no major deformities or cyanosis. NEUROLOGIC: No focal weakness or paresthesias are detected. SKIN: There are no ulcers or rashes noted. PSYCHIATRIC: The patient has a normal affect.  DATA:  Duplex from 323 reviewed showing occlusive thrombus in her left femoral vein, popliteal vein  and posterior tibial vein  MEDICAL ISSUES: Long discussion with the patient.  She does not have any history to suggest hypercoagulable state.  I suspect this is related to her immobility and obesity.  I have recommended 6 months of anticoagulation therapy and then discontinue.  Would only recommend longer time term treatment if she has recurrent thrombus.  She does have some chronic lower extremity swelling in all likelihood related to her obesity.  I have recommended knee-high 20 to 30 mm compression and we have fitted her with these today.  I would not recommend repeat duplex.  Would recommend 6 months of anticoagulation and then discontinuation.  She will see [place] again on an as-needed basis   Rosetta Posner, MD [facility] Tel [phone] Pager [phone]

## 2019-06-21 DIAGNOSIS — R3989 Other symptoms and signs involving the genitourinary system: Secondary | ICD-10-CM | POA: Diagnosis not present

## 2019-06-21 DIAGNOSIS — Z3202 Encounter for pregnancy test, result negative: Secondary | ICD-10-CM | POA: Diagnosis not present

## 2019-06-21 DIAGNOSIS — Z30430 Encounter for insertion of intrauterine contraceptive device: Secondary | ICD-10-CM | POA: Diagnosis not present

## 2019-07-31 DIAGNOSIS — M545 Low back pain: Secondary | ICD-10-CM | POA: Diagnosis not present

## 2019-08-02 DIAGNOSIS — Z01419 Encounter for gynecological examination (general) (routine) without abnormal findings: Secondary | ICD-10-CM | POA: Diagnosis not present

## 2019-10-31 DIAGNOSIS — F410 Panic disorder [episodic paroxysmal anxiety] without agoraphobia: Secondary | ICD-10-CM | POA: Diagnosis not present

## 2019-10-31 DIAGNOSIS — F4321 Adjustment disorder with depressed mood: Secondary | ICD-10-CM | POA: Diagnosis not present

## 2019-10-31 DIAGNOSIS — Z634 Disappearance and death of family member: Secondary | ICD-10-CM | POA: Diagnosis not present

## 2019-11-14 ENCOUNTER — Other Ambulatory Visit: Payer: Self-pay | Admitting: Family Medicine

## 2019-11-14 DIAGNOSIS — Z1231 Encounter for screening mammogram for malignant neoplasm of breast: Secondary | ICD-10-CM

## 2019-11-26 DIAGNOSIS — E559 Vitamin D deficiency, unspecified: Secondary | ICD-10-CM | POA: Diagnosis not present

## 2019-11-26 DIAGNOSIS — E785 Hyperlipidemia, unspecified: Secondary | ICD-10-CM | POA: Diagnosis not present

## 2019-11-26 DIAGNOSIS — R350 Frequency of micturition: Secondary | ICD-10-CM | POA: Diagnosis not present

## 2019-11-26 DIAGNOSIS — E118 Type 2 diabetes mellitus with unspecified complications: Secondary | ICD-10-CM | POA: Diagnosis not present

## 2019-11-26 DIAGNOSIS — Z720 Tobacco use: Secondary | ICD-10-CM | POA: Diagnosis not present

## 2019-12-10 DIAGNOSIS — E785 Hyperlipidemia, unspecified: Secondary | ICD-10-CM | POA: Diagnosis not present

## 2019-12-10 DIAGNOSIS — N951 Menopausal and female climacteric states: Secondary | ICD-10-CM | POA: Diagnosis not present

## 2019-12-10 DIAGNOSIS — R635 Abnormal weight gain: Secondary | ICD-10-CM | POA: Diagnosis not present

## 2019-12-10 DIAGNOSIS — E119 Type 2 diabetes mellitus without complications: Secondary | ICD-10-CM | POA: Diagnosis not present

## 2019-12-10 DIAGNOSIS — R790 Abnormal level of blood mineral: Secondary | ICD-10-CM | POA: Diagnosis not present

## 2019-12-10 DIAGNOSIS — E559 Vitamin D deficiency, unspecified: Secondary | ICD-10-CM | POA: Diagnosis not present

## 2019-12-12 DIAGNOSIS — Z1339 Encounter for screening examination for other mental health and behavioral disorders: Secondary | ICD-10-CM | POA: Diagnosis not present

## 2019-12-12 DIAGNOSIS — E559 Vitamin D deficiency, unspecified: Secondary | ICD-10-CM | POA: Diagnosis not present

## 2019-12-12 DIAGNOSIS — Z1331 Encounter for screening for depression: Secondary | ICD-10-CM | POA: Diagnosis not present

## 2019-12-12 DIAGNOSIS — E785 Hyperlipidemia, unspecified: Secondary | ICD-10-CM | POA: Diagnosis not present

## 2019-12-12 DIAGNOSIS — R635 Abnormal weight gain: Secondary | ICD-10-CM | POA: Diagnosis not present

## 2019-12-12 DIAGNOSIS — E119 Type 2 diabetes mellitus without complications: Secondary | ICD-10-CM | POA: Diagnosis not present

## 2019-12-18 DIAGNOSIS — E118 Type 2 diabetes mellitus with unspecified complications: Secondary | ICD-10-CM | POA: Diagnosis not present

## 2019-12-18 DIAGNOSIS — F4321 Adjustment disorder with depressed mood: Secondary | ICD-10-CM | POA: Diagnosis not present

## 2019-12-18 DIAGNOSIS — K219 Gastro-esophageal reflux disease without esophagitis: Secondary | ICD-10-CM | POA: Diagnosis not present

## 2019-12-18 DIAGNOSIS — D729 Disorder of white blood cells, unspecified: Secondary | ICD-10-CM | POA: Diagnosis not present

## 2019-12-19 ENCOUNTER — Other Ambulatory Visit: Payer: Self-pay | Admitting: Family Medicine

## 2019-12-19 DIAGNOSIS — Z6841 Body Mass Index (BMI) 40.0 and over, adult: Secondary | ICD-10-CM | POA: Diagnosis not present

## 2019-12-19 DIAGNOSIS — R1010 Upper abdominal pain, unspecified: Secondary | ICD-10-CM

## 2019-12-19 DIAGNOSIS — Z9049 Acquired absence of other specified parts of digestive tract: Secondary | ICD-10-CM

## 2019-12-19 DIAGNOSIS — E119 Type 2 diabetes mellitus without complications: Secondary | ICD-10-CM | POA: Diagnosis not present

## 2019-12-20 ENCOUNTER — Other Ambulatory Visit: Payer: Self-pay | Admitting: Gastroenterology

## 2019-12-20 DIAGNOSIS — K529 Noninfective gastroenteritis and colitis, unspecified: Secondary | ICD-10-CM | POA: Diagnosis not present

## 2019-12-20 DIAGNOSIS — Z86010 Personal history of colonic polyps: Secondary | ICD-10-CM | POA: Diagnosis not present

## 2019-12-20 DIAGNOSIS — R1010 Upper abdominal pain, unspecified: Secondary | ICD-10-CM | POA: Diagnosis not present

## 2019-12-26 ENCOUNTER — Ambulatory Visit
Admission: RE | Admit: 2019-12-26 | Discharge: 2019-12-26 | Disposition: A | Discharge status: Home / Self Care | Payer: BC Managed Care – PPO | Source: Ambulatory Visit | Attending: Family Medicine | Admitting: Family Medicine

## 2019-12-26 ENCOUNTER — Other Ambulatory Visit: Payer: Self-pay

## 2019-12-26 DIAGNOSIS — Z1231 Encounter for screening mammogram for malignant neoplasm of breast: Secondary | ICD-10-CM

## 2020-01-08 ENCOUNTER — Ambulatory Visit
Admission: RE | Admit: 2020-01-08 | Discharge: 2020-01-08 | Disposition: A | Discharge status: Home / Self Care | Payer: BC Managed Care – PPO | Source: Ambulatory Visit | Attending: Family Medicine | Admitting: Family Medicine

## 2020-01-08 ENCOUNTER — Other Ambulatory Visit: Payer: BC Managed Care – PPO

## 2020-01-08 DIAGNOSIS — R1010 Upper abdominal pain, unspecified: Secondary | ICD-10-CM

## 2020-01-08 DIAGNOSIS — K760 Fatty (change of) liver, not elsewhere classified: Secondary | ICD-10-CM | POA: Diagnosis not present

## 2020-01-08 DIAGNOSIS — Z9049 Acquired absence of other specified parts of digestive tract: Secondary | ICD-10-CM

## 2020-01-16 NOTE — Progress Notes (Signed)
Pt will be cancelling procedure .

## 2020-01-20 ENCOUNTER — Other Ambulatory Visit: Payer: Self-pay

## 2020-01-22 LAB — SARS CORONAVIRUS 2 (TAT 6-24 HRS): SARS Coronavirus 2: POSITIVE — AB

## 2020-01-25 ENCOUNTER — Inpatient Hospital Stay (HOSPITAL_COMMUNITY)
Admission: EM | Admit: 2020-01-25 | Discharge: 2020-01-27 | DRG: 604 | Disposition: A | Discharge status: Home / Self Care | Payer: BC Managed Care – PPO | Attending: Family Medicine | Admitting: Family Medicine

## 2020-01-25 ENCOUNTER — Other Ambulatory Visit: Payer: Self-pay

## 2020-01-25 ENCOUNTER — Other Ambulatory Visit (HOSPITAL_COMMUNITY): Payer: BC Managed Care – PPO

## 2020-01-25 ENCOUNTER — Encounter (HOSPITAL_COMMUNITY): Payer: Self-pay | Admitting: Emergency Medicine

## 2020-01-25 ENCOUNTER — Emergency Department (HOSPITAL_COMMUNITY): Payer: BC Managed Care – PPO

## 2020-01-25 DIAGNOSIS — I10 Essential (primary) hypertension: Secondary | ICD-10-CM | POA: Diagnosis not present

## 2020-01-25 DIAGNOSIS — Z888 Allergy status to other drugs, medicaments and biological substances status: Secondary | ICD-10-CM

## 2020-01-25 DIAGNOSIS — F419 Anxiety disorder, unspecified: Secondary | ICD-10-CM | POA: Diagnosis present

## 2020-01-25 DIAGNOSIS — S301XXA Contusion of abdominal wall, initial encounter: Principal | ICD-10-CM | POA: Diagnosis present

## 2020-01-25 DIAGNOSIS — X58XXXA Exposure to other specified factors, initial encounter: Secondary | ICD-10-CM | POA: Diagnosis not present

## 2020-01-25 DIAGNOSIS — R52 Pain, unspecified: Secondary | ICD-10-CM | POA: Diagnosis not present

## 2020-01-25 DIAGNOSIS — Z7984 Long term (current) use of oral hypoglycemic drugs: Secondary | ICD-10-CM | POA: Diagnosis not present

## 2020-01-25 DIAGNOSIS — Z79899 Other long term (current) drug therapy: Secondary | ICD-10-CM | POA: Diagnosis not present

## 2020-01-25 DIAGNOSIS — E119 Type 2 diabetes mellitus without complications: Secondary | ICD-10-CM | POA: Diagnosis present

## 2020-01-25 DIAGNOSIS — Z86718 Personal history of other venous thrombosis and embolism: Secondary | ICD-10-CM | POA: Diagnosis not present

## 2020-01-25 DIAGNOSIS — J449 Chronic obstructive pulmonary disease, unspecified: Secondary | ICD-10-CM | POA: Diagnosis present

## 2020-01-25 DIAGNOSIS — F17210 Nicotine dependence, cigarettes, uncomplicated: Secondary | ICD-10-CM | POA: Diagnosis present

## 2020-01-25 DIAGNOSIS — K760 Fatty (change of) liver, not elsewhere classified: Secondary | ICD-10-CM | POA: Diagnosis not present

## 2020-01-25 DIAGNOSIS — R1084 Generalized abdominal pain: Secondary | ICD-10-CM | POA: Diagnosis not present

## 2020-01-25 DIAGNOSIS — U071 COVID-19: Secondary | ICD-10-CM | POA: Diagnosis not present

## 2020-01-25 DIAGNOSIS — R1031 Right lower quadrant pain: Secondary | ICD-10-CM | POA: Diagnosis not present

## 2020-01-25 LAB — CBC WITH DIFFERENTIAL/PLATELET
Abs Immature Granulocytes: 0.03 10*3/uL (ref 0.00–0.07)
Basophils Absolute: 0 10*3/uL (ref 0.0–0.1)
Basophils Relative: 0 %
Eosinophils Absolute: 0.1 10*3/uL (ref 0.0–0.5)
Eosinophils Relative: 1 %
HCT: 42.3 % (ref 36.0–46.0)
Hemoglobin: 13.9 g/dL (ref 12.0–15.0)
Immature Granulocytes: 1 %
Lymphocytes Relative: 22 %
Lymphs Abs: 1.2 10*3/uL (ref 0.7–4.0)
MCH: 26.9 pg (ref 26.0–34.0)
MCHC: 32.9 g/dL (ref 30.0–36.0)
MCV: 82 fL (ref 80.0–100.0)
Monocytes Absolute: 0.2 10*3/uL (ref 0.1–1.0)
Monocytes Relative: 4 %
Neutro Abs: 3.9 10*3/uL (ref 1.7–7.7)
Neutrophils Relative %: 72 %
Platelets: 248 10*3/uL (ref 150–400)
RBC: 5.16 MIL/uL — ABNORMAL HIGH (ref 3.87–5.11)
RDW: 13.2 % (ref 11.5–15.5)
WBC: 5.4 10*3/uL (ref 4.0–10.5)
nRBC: 0 % (ref 0.0–0.2)

## 2020-01-25 LAB — I-STAT BETA HCG BLOOD, ED (MC, WL, AP ONLY): I-stat hCG, quantitative: <5 m[IU]/mL (ref <5)

## 2020-01-25 LAB — LIPASE, BLOOD: Lipase: 33 U/L (ref 11–51)

## 2020-01-25 LAB — COMPREHENSIVE METABOLIC PANEL
ALT: 27 U/L (ref 0–44)
AST: 26 U/L (ref 15–41)
Albumin: 3.6 g/dL (ref 3.5–5.0)
Alkaline Phosphatase: 75 U/L (ref 38–126)
Anion gap: 9 (ref 5–15)
BUN: 11 mg/dL (ref 6–20)
CO2: 28 mmol/L (ref 22–32)
Calcium: 8.7 mg/dL — ABNORMAL LOW (ref 8.9–10.3)
Chloride: 100 mmol/L (ref 98–111)
Creatinine, Ser: 0.7 mg/dL (ref 0.44–1.00)
GFR, Estimated: >60 mL/min (ref >60)
Glucose, Bld: 218 mg/dL — ABNORMAL HIGH (ref 70–99)
Potassium: 3.9 mmol/L (ref 3.5–5.1)
Sodium: 137 mmol/L (ref 135–145)
Total Bilirubin: 0.4 mg/dL (ref 0.3–1.2)
Total Protein: 7.4 g/dL (ref 6.5–8.1)

## 2020-01-25 MED ORDER — IOHEXOL 300 MG/ML  SOLN
100.0000 mL | Freq: Once | INTRAMUSCULAR | Status: AC | PRN
  Administered 2020-01-25: 100 mL via INTRAVENOUS

## 2020-01-25 MED ORDER — SODIUM CHLORIDE 0.9 % IV BOLUS
1000.0000 mL | Freq: Once | INTRAVENOUS | Status: AC
  Administered 2020-01-26: 1000 mL via INTRAVENOUS

## 2020-01-25 MED ORDER — ONDANSETRON HCL 4 MG/2ML IJ SOLN
4.0000 mg | Freq: Once | INTRAMUSCULAR | Status: AC
  Administered 2020-01-25: 4 mg via INTRAVENOUS
  Filled 2020-01-25: qty 2

## 2020-01-25 MED ORDER — HYDROMORPHONE HCL 1 MG/ML IJ SOLN
1.0000 mg | Freq: Once | INTRAMUSCULAR | Status: AC
  Administered 2020-01-25: 1 mg via INTRAVENOUS
  Filled 2020-01-25: qty 1

## 2020-01-25 NOTE — ED Triage Notes (Signed)
Per EMS, pt from home, covid symptoms starting last Saturday. Tested positive Monday 01/20/2020. Complaining of RLQ pain, that moves into the RUQ, while coughing.

## 2020-01-25 NOTE — ED Provider Notes (Signed)
[facility] DEPT Provider Note   CSN: [id number] Arrival date & time: 01/25/20  1948     History Chief Complaint  Patient presents with  . Abdominal Pain  . Covid Positive    [name] [name] is a 50 y.o. female.  50 year old female brought in by EMS from home for abdominal pain.  Patient states that she tested positive for COVID 5 days ago, felt like she had originally pulled a muscle as she was having pain a few inches to the right of her umbilicus, worse with coughing since Monday.  States pain is gotten progressively worse until today when she was on the phone and pain became significantly worse and prompted her to come to the ER.  Denies nausea or vomiting, states that she has constipation at baseline has been taking medication for this.  Prior abdominal surgeries include laparoscopic cholecystectomy.  No other complaints or concerns today.  [name] [name] was evaluated in Emergency Department on 01/25/2020 for the symptoms described in the history of present illness. She was evaluated in the context of the global COVID-19 pandemic, which necessitated consideration that the patient might be at risk for infection with the SARS-CoV-2 virus that causes COVID-19. Institutional protocols and algorithms that pertain to the evaluation of patients at risk for COVID-19 are in a state of rapid change based on information released by regulatory bodies including the CDC and federal and state organizations. These policies and algorithms were followed during the patient's care in the ED.         Past Medical History:  Diagnosis Date  . Abdominal pain   . Bronchitis   . Complication of anesthesia    hard to wake up  . Diabetes mellitus   . Nipple discharge   . Pneumonia     Patient Active Problem List   Diagnosis Date Noted  . Rectus sheath hematoma, initial encounter 01/25/2020  . Type II or unspecified type diabetes mellitus without mention of  complication, not stated as uncontrolled 07/23/2012    Past Surgical History:  Procedure Laterality Date  . CHOLECYSTECTOMY    . COLONOSCOPY WITH PROPOFOL N/A 05/07/2014   Procedure: COLONOSCOPY WITH PROPOFOL;  Surgeon: Arta Silence, MD;  Location: [facility] ENDOSCOPY;  Service: Endoscopy;  Laterality: N/A;  . corrective leg surgery    . DILATION AND CURETTAGE OF UTERUS    . GANGLION CYST EXCISION     right wrist  . ganglion cyst removed    . LEG SURGERY     corrective surgery to right leg x 2  . TONSILECTOMY, ADENOIDECTOMY, BILATERAL MYRINGOTOMY AND TUBES    . TONSILLECTOMY     age 50     OB History   No obstetric history on file.     Family History  Adopted: Yes    Social History   Tobacco Use  . Smoking status: Current Every Day Smoker    Packs/day: 0.50    Types: Cigarettes  . Smokeless tobacco: Never Used  Substance Use Topics  . Alcohol use: Yes    Alcohol/week: 2.0 standard drinks    Types: 2 Standard drinks or equivalent per week  . Drug use: No    Home Medications Prior to Admission medications   Medication Sig Start Date End Date Taking? Authorizing Provider  ALPRAZolam (XANAX) 0.25 MG tablet Take 0.25 mg by mouth 2 (two) times daily as needed for anxiety. 04/02/19  Yes [provider]  atorvastatin (LIPITOR) 20 MG tablet Take 20 mg  by mouth daily. 04/02/19  Yes [provider]  cholecalciferol (VITAMIN D3) 25 MCG (1000 UNIT) tablet Take 2,000 Units by mouth every evening.   Yes [provider]  cholestyramine (QUESTRAN) 4 g packet Take 4 g by mouth daily. 01/22/20  Yes [provider]  ibuprofen (ADVIL,MOTRIN) 200 MG tablet Take 200-800 mg by mouth every 6 (six) hours as needed for moderate pain (Pain).   Yes [provider]  lisinopril (ZESTRIL) 5 MG tablet Take 5 mg by mouth daily. 12/08/19  Yes [provider]  metFORMIN (GLUCOPHAGE-XR) 500 MG 24 hr tablet Take 500 mg by mouth 2 (two) times daily.   Yes  [provider]  omeprazole (PRILOSEC) 20 MG capsule Take 40 mg by mouth daily. 04/02/19  Yes [provider]  Probiotic Product (RESTORA PO) Take 1 tablet by mouth every evening.   Yes [provider]  albuterol (PROVENTIL HFA;VENTOLIN HFA) 108 (90 BASE) MCG/ACT inhaler Inhale 1 puff into the lungs every 6 (six) hours as needed for wheezing or shortness of breath.    [provider]    Allergies    Musk and Other  Review of Systems   Review of Systems  Constitutional: Negative for chills and fever.  Respiratory: Positive for cough.   Cardiovascular: Negative for chest pain.  Gastrointestinal: Positive for abdominal pain and constipation. Negative for blood in stool, diarrhea, nausea and vomiting.  Genitourinary: Negative for dysuria and frequency.  Musculoskeletal: Negative for arthralgias and myalgias.  Skin: Negative for rash.  Allergic/Immunologic: Positive for immunocompromised state.  Neurological: Negative for weakness.  All other systems reviewed and are negative.   Physical Exam Updated Vital Signs BP 104/62 (BP Location: Left Arm)   Pulse 91   Temp 98.3 F (36.8 C) (Oral)   Resp 20   Ht 5\' 4"  (1.626 m)   Wt 132.9 kg   LMP 01/23/2020   SpO2 95%   BMI 50.29 kg/m   Physical Exam Vitals and nursing note reviewed.  Constitutional:      General: She is not in acute distress.    Appearance: She is well-developed and well-nourished. She is obese. She is not diaphoretic.  HENT:     Head: Normocephalic and atraumatic.  Cardiovascular:     Rate and Rhythm: Regular rhythm. Tachycardia present.     Heart sounds: Normal heart sounds.  Pulmonary:     Effort: Pulmonary effort is normal.     Breath sounds: Normal breath sounds.  Abdominal:     Tenderness: There is abdominal tenderness.    Skin:    General: Skin is warm and dry.     Findings: No erythema or rash.  Neurological:     Mental Status: She is alert and oriented to person,  place, and time.  Psychiatric:        Mood and Affect: Mood and affect normal.        Behavior: Behavior normal.     ED Results / Procedures / Treatments   Labs (all labs ordered are listed, but only abnormal results are displayed) Labs Reviewed  CBC WITH DIFFERENTIAL/PLATELET - Abnormal; Notable for the following components:      Result Value   RBC 5.16 (*)    All other components within normal limits  COMPREHENSIVE METABOLIC PANEL - Abnormal; Notable for the following components:   Glucose, Bld 218 (*)    Calcium 8.7 (*)    All other components within normal limits  LIPASE, BLOOD  URINALYSIS, ROUTINE  W REFLEX MICROSCOPIC  I-STAT BETA HCG BLOOD, ED ([facility], [facility], [facility] ONLY)    EKG None  Radiology CT Abdomen Pelvis W Contrast  Result Date: 01/25/2020 CLINICAL DATA:  COVID positive.  Right lower quadrant pain. EXAM: CT ABDOMEN AND PELVIS WITH CONTRAST TECHNIQUE: Multidetector CT imaging of the abdomen and pelvis was performed using the standard protocol following bolus administration of intravenous contrast. CONTRAST:  OMNIPAQUE IOHEXOL 300 MG/ML  SOLN COMPARISON:  July 04, 2010 FINDINGS: Lower chest: No acute abnormality. Hepatobiliary: There is diffuse fatty infiltration of the liver parenchyma. No focal liver abnormality is seen. Status post cholecystectomy. No biliary dilatation. Pancreas: Unremarkable. No pancreatic ductal dilatation or surrounding inflammatory changes. Spleen: Normal in size without focal abnormality. Adrenals/Urinary Tract: Adrenal glands are unremarkable. Kidneys are normal in size, without renal calculi or hydronephrosis. Cortical scarring is seen along the anterolateral aspect of the mid left kidney. Bladder is unremarkable. Stomach/Bowel: Stomach is within normal limits. Appendix appears normal. No evidence of bowel wall thickening, distention, or inflammatory changes. Vascular/Lymphatic: No significant vascular findings are present. No enlarged abdominal or  pelvic lymph nodes. Reproductive: An IUD is seen within an otherwise normal appearing uterus. Other: An 11.3 cm x 5.9 cm x 11.7 cm area of heterogeneous increased attenuation is seen within the region of the right rectus sheath. Asymmetric hypertrophy of the adjacent anterior abdominal wall musculature is noted. Musculoskeletal: No acute or significant osseous findings. IMPRESSION: 1. Large right rectus sheath hematoma without evidence to suggest active bleeding. 2. Hepatic steatosis. 3. Evidence of prior cholecystectomy. Electronically Signed   By: Aram Candela M.D.   On: 01/25/2020 22:22    Procedures Procedures (including critical care time)  Medications Ordered in ED Medications  sodium chloride 0.9 % bolus 1,000 mL (has no administration in time range)  HYDROmorphone (DILAUDID) injection 1 mg (1 mg Intravenous Given 01/25/20 2038)  ondansetron (ZOFRAN) injection 4 mg (4 mg Intravenous Given 01/25/20 2040)  iohexol (OMNIPAQUE) 300 MG/ML solution 100 mL (100 mLs Intravenous Contrast Given 01/25/20 2151)    ED Course  I have reviewed the triage vital signs and the nursing notes.  Pertinent labs & imaging results that were available during my care of the patient were reviewed by me and considered in my medical decision making (see chart for details).  Clinical Course as of 01/25/20 2310  Sat Jan 25, 2020  [id number] 50 year old female with complaint of right abdominal pain onset Monday (5 days ago) while coughing (known COVID +), progressively worsening, suddenly worse tonight which prompted ER visit. On exam, has a tender, palpable mass to right side abdomen without overlying skin changes. CT shows large rectus sheath hematoma (11.3cm x 5.9cm x 11.7cm) without active bleeding, patient is not anticoagulated.  Labs reassuring with normal H&H.  Discussed with Dr. Charm Barges, ER attending, plan is to consult for admission for monitoring and pain control.  [LM]  2305 Case discussed with Dr. Antionette Char with  [facility] who will consult for admission.  [LM]    Clinical Course User Index [LM] Alden Hipp   MDM Rules/Calculators/A&P                          Final Clinical Impression(s) / ED Diagnoses Final diagnoses:  Rectus sheath hematoma, initial encounter    Rx / DC Orders ED Discharge Orders    None       Jeannie Fend, PA-C 01/25/20 2310  Hayden Rasmussen, MD 01/26/20 1004

## 2020-01-26 DIAGNOSIS — Z888 Allergy status to other drugs, medicaments and biological substances status: Secondary | ICD-10-CM | POA: Diagnosis not present

## 2020-01-26 DIAGNOSIS — S3011XA Contusion of abdominal wall, initial encounter: Secondary | ICD-10-CM | POA: Diagnosis present

## 2020-01-26 DIAGNOSIS — Z86718 Personal history of other venous thrombosis and embolism: Secondary | ICD-10-CM | POA: Diagnosis not present

## 2020-01-26 DIAGNOSIS — F17210 Nicotine dependence, cigarettes, uncomplicated: Secondary | ICD-10-CM | POA: Diagnosis present

## 2020-01-26 DIAGNOSIS — J449 Chronic obstructive pulmonary disease, unspecified: Secondary | ICD-10-CM | POA: Diagnosis present

## 2020-01-26 DIAGNOSIS — U071 COVID-19: Secondary | ICD-10-CM | POA: Diagnosis present

## 2020-01-26 DIAGNOSIS — Z7984 Long term (current) use of oral hypoglycemic drugs: Secondary | ICD-10-CM | POA: Diagnosis not present

## 2020-01-26 DIAGNOSIS — X58XXXA Exposure to other specified factors, initial encounter: Secondary | ICD-10-CM | POA: Diagnosis present

## 2020-01-26 DIAGNOSIS — F419 Anxiety disorder, unspecified: Secondary | ICD-10-CM | POA: Diagnosis present

## 2020-01-26 DIAGNOSIS — I10 Essential (primary) hypertension: Secondary | ICD-10-CM | POA: Diagnosis present

## 2020-01-26 DIAGNOSIS — Z79899 Other long term (current) drug therapy: Secondary | ICD-10-CM | POA: Diagnosis not present

## 2020-01-26 DIAGNOSIS — S301XXA Contusion of abdominal wall, initial encounter: Secondary | ICD-10-CM | POA: Diagnosis present

## 2020-01-26 DIAGNOSIS — E119 Type 2 diabetes mellitus without complications: Secondary | ICD-10-CM | POA: Diagnosis present

## 2020-01-26 LAB — TYPE AND SCREEN
ABO/RH(D): O NEG
Antibody Screen: NEGATIVE

## 2020-01-26 LAB — CBC
HCT: 39.1 % (ref 36.0–46.0)
Hemoglobin: 12.9 g/dL (ref 12.0–15.0)
MCH: 27 pg (ref 26.0–34.0)
MCHC: 33 g/dL (ref 30.0–36.0)
MCV: 81.8 fL (ref 80.0–100.0)
Platelets: 260 10*3/uL (ref 150–400)
RBC: 4.78 MIL/uL (ref 3.87–5.11)
RDW: 13.2 % (ref 11.5–15.5)
WBC: 5.8 10*3/uL (ref 4.0–10.5)
nRBC: 0 % (ref 0.0–0.2)

## 2020-01-26 LAB — BASIC METABOLIC PANEL
Anion gap: 11 (ref 5–15)
BUN: 12 mg/dL (ref 6–20)
CO2: 27 mmol/L (ref 22–32)
Calcium: 8.7 mg/dL — ABNORMAL LOW (ref 8.9–10.3)
Chloride: 99 mmol/L (ref 98–111)
Creatinine, Ser: 0.69 mg/dL (ref 0.44–1.00)
GFR, Estimated: >60 mL/min (ref >60)
Glucose, Bld: 188 mg/dL — ABNORMAL HIGH (ref 70–99)
Potassium: 3.9 mmol/L (ref 3.5–5.1)
Sodium: 137 mmol/L (ref 135–145)

## 2020-01-26 LAB — HIV ANTIBODY (ROUTINE TESTING W REFLEX): HIV Screen 4th Generation wRfx: NONREACTIVE

## 2020-01-26 LAB — ABO/RH: ABO/RH(D): O NEG

## 2020-01-26 MED ORDER — GUAIFENESIN-DM 100-10 MG/5ML PO SYRP: 5.0000 mL | ORAL_SOLUTION | ORAL | Status: DC | PRN

## 2020-01-26 MED ORDER — SODIUM CHLORIDE 0.9 % IV SOLN: INTRAVENOUS | Status: DC

## 2020-01-26 MED ORDER — MORPHINE SULFATE (PF) 2 MG/ML IV SOLN
2.0000 mg | INTRAVENOUS | Status: DC | PRN
  Administered 2020-01-26: 2 mg via INTRAVENOUS
  Filled 2020-01-26: qty 1

## 2020-01-26 MED ORDER — ALPRAZOLAM 0.25 MG PO TABS: 0.2500 mg | ORAL_TABLET | Freq: Two times a day (BID) | ORAL | Status: DC | PRN

## 2020-01-26 MED ORDER — SENNOSIDES-DOCUSATE SODIUM 8.6-50 MG PO TABS: 1.0000 | ORAL_TABLET | Freq: Every evening | ORAL | Status: DC | PRN

## 2020-01-26 MED ORDER — ALBUTEROL SULFATE HFA 108 (90 BASE) MCG/ACT IN AERS: 1.0000 | INHALATION_SPRAY | Freq: Four times a day (QID) | RESPIRATORY_TRACT | Status: DC | PRN

## 2020-01-26 MED ORDER — BENZONATATE 100 MG PO CAPS: 200.0000 mg | ORAL_CAPSULE | Freq: Three times a day (TID) | ORAL | Status: DC

## 2020-01-26 MED ORDER — LISINOPRIL 5 MG PO TABS
5.0000 mg | ORAL_TABLET | Freq: Every day | ORAL | Status: DC
  Administered 2020-01-26 – 2020-01-27 (×2): 5 mg via ORAL
  Filled 2020-01-26 (×3): qty 1

## 2020-01-26 MED ORDER — OXYCODONE HCL 5 MG PO TABS
5.0000 mg | ORAL_TABLET | ORAL | Status: DC | PRN
  Administered 2020-01-26: 5 mg via ORAL
  Filled 2020-01-26: qty 1

## 2020-01-26 MED ORDER — ATORVASTATIN CALCIUM 20 MG PO TABS
20.0000 mg | ORAL_TABLET | Freq: Every day | ORAL | Status: DC
  Administered 2020-01-26: 20 mg via ORAL
  Filled 2020-01-26: qty 1

## 2020-01-26 MED ORDER — HYDROCOD POLST-CPM POLST ER 10-8 MG/5ML PO SUER
5.0000 mL | Freq: Four times a day (QID) | ORAL | Status: DC
Start: 2020-01-26 — End: 2020-01-27
  Administered 2020-01-26 – 2020-01-27 (×5): 5 mL via ORAL
  Filled 2020-01-26 (×5): qty 5

## 2020-01-26 MED ORDER — ONDANSETRON HCL 4 MG/2ML IJ SOLN: 4.0000 mg | Freq: Four times a day (QID) | INTRAMUSCULAR | Status: DC | PRN

## 2020-01-26 MED ORDER — BISACODYL 5 MG PO TBEC
5.0000 mg | DELAYED_RELEASE_TABLET | Freq: Every day | ORAL | Status: DC | PRN
Start: 2020-01-26 — End: 2020-01-27

## 2020-01-26 MED ORDER — ACETAMINOPHEN 325 MG PO TABS: 650.0000 mg | ORAL_TABLET | Freq: Four times a day (QID) | ORAL | Status: DC | PRN

## 2020-01-26 MED ORDER — HYDROCOD POLST-CPM POLST ER 10-8 MG/5ML PO SUER: 5.0000 mL | Freq: Two times a day (BID) | ORAL | Status: DC | PRN

## 2020-01-26 MED ORDER — ACETAMINOPHEN 650 MG RE SUPP: 650.0000 mg | Freq: Four times a day (QID) | RECTAL | Status: DC | PRN

## 2020-01-26 MED ORDER — BENZONATATE 100 MG PO CAPS
200.0000 mg | ORAL_CAPSULE | Freq: Three times a day (TID) | ORAL | Status: DC
Start: 2020-01-26 — End: 2020-01-27
  Administered 2020-01-26 – 2020-01-27 (×5): 200 mg via ORAL
  Filled 2020-01-26 (×5): qty 2

## 2020-01-26 MED ORDER — ONDANSETRON HCL 4 MG PO TABS: 4.0000 mg | ORAL_TABLET | Freq: Four times a day (QID) | ORAL | Status: DC | PRN

## 2020-01-26 MED ORDER — SODIUM CHLORIDE 0.9 % IV SOLN: INTRAVENOUS | Status: AC

## 2020-01-26 MED ORDER — PANTOPRAZOLE SODIUM 40 MG PO TBEC
40.0000 mg | DELAYED_RELEASE_TABLET | Freq: Every day | ORAL | Status: DC
  Administered 2020-01-26 – 2020-01-27 (×2): 40 mg via ORAL
  Filled 2020-01-26 (×2): qty 1

## 2020-01-26 NOTE — ED Notes (Signed)
Attempted to call report. Will attempt again.

## 2020-01-26 NOTE — Progress Notes (Signed)
PROGRESS NOTE   [name] [name]  IRS:[id number] DOB: 1970-03-23 DOA: 01/25/2020 PCP: Leighton Ruff, MD  Brief Narrative:  50 year old white female Underlying COPD, DM TY 2, prior tobacco, BMI 50, DVT 03/23/2018 with extensive left femoral-popliteal and tibial vein clot placed on Xarelto for 6 months then discontinued Diagnosed with COVID 01/19/2018 1:22 day of symptoms right lower quadrant pain which progressed and got worse Came to the ED 1/15-she had a temperature palpable mass to right side-CT showed large rectus sheath hematoma 11 X6X 11 without active bleeding No longer having active symptoms of shortness of breath etc. at this time  Assessment & Plan:   Principal Problem:   Rectus sheath hematoma, initial encounter Active Problems:   Diabetes mellitus type II, non insulin dependent ([place])   COVID-19 virus infection   Anxiety disorder   History of deep vein thrombosis (DVT) of lower extremity   1. Rectus sheath hematoma secondary to coughing a. Pain manageable at this time she does not feel it is worsening b. Advised Oxy IR first choice and morphine as needed for severe pain c. Repeat CBC in a.m. 1/17-if stable and patient ambulatory probably can discharge home d. Try to suppress cough with Hycodan syrup which we will increase to every 6 2. COVID-19 diagnosed 1/10 with no symptoms currently a. No current severe symptoms b. Continue fluids at 50 cc an hour 3. Prior DVT 03/23/2018 completed 6 months of anticoagulation a. No further work-up at this time is not on anticoagulation b. Monitor trends 4. BMI 50 5. DM TY 2 6. Prior tobacco COPD a. Albuterol as needed-can use Robitussin first choice with Tessalon and Tussionex with morphine to suppress the cough  DVT prophylaxis: SCD Code Status: Full Family Communication: None Disposition:  Status is: Observation overnight  The patient will require care spanning > 2 midnights and should be moved to inpatient because:  Hemodynamically unstable, Persistent severe electrolyte disturbances, Ongoing diagnostic testing needed not appropriate for outpatient work up and IV treatments appropriate due to intensity of illness or inability to take PO  Dispo: The patient is from: Home              Anticipated d/c is to: Home              Anticipated d/c date is: 1 day              Patient currently is not medically stable to d/c.       Consultants:   None  Procedures: No  Antimicrobials: None   Subjective: Awake alert coherent no distress Pain seems manageable and not severe No chest pain no fever Cough is reasonably controlled   Objective: Vitals:   01/26/20 0600 01/26/20 0615 01/26/20 0630 01/26/20 0730  BP: 107/67  112/81 105/71  Pulse: 77 74 77 83  Resp: 20 20 (!) 21 (!) 21  Temp:      TempSrc:      SpO2: 91% 96% 92% 94%  Weight:      Height:        Intake/Output Summary (Last 24 hours) at 01/26/2020 0824 Last data filed at 01/26/2020 0817 Gross per 24 hour  Intake 1000 ml  Output --  Net 1000 ml   Filed Weights   01/25/20 2006  Weight: 132.9 kg    Examination:  EOMI NCAT no focal deficit no icterus no pallor Obese Mallampati 4 Chest clear no added sound no rales rhonchi Abdomen soft with some tenderness on the right side Neurologically  intact moving 4 limbs equally without deficit Underlying chronic venous stasis noted with skin changes   Data Reviewed: I have personally reviewed following labs and imaging studies  BUN/creatinine 11/0.7-->12/0.6 White count 5.8 Hemoglobin 12.9 Platelet 260   COVID-19 Labs  No results for input(s): DDIMER, FERRITIN, LDH, CRP in the last 72 hours.  Lab Results  Component Value Date   SARSCOV2NAA RESULT: POSITIVE (A) 01/20/2020     Radiology Studies: CT Abdomen Pelvis W Contrast  Result Date: 01/25/2020 CLINICAL DATA:  COVID positive.  Right lower quadrant pain. EXAM: CT ABDOMEN AND PELVIS WITH CONTRAST TECHNIQUE: Multidetector  CT imaging of the abdomen and pelvis was performed using the standard protocol following bolus administration of intravenous contrast. CONTRAST:  127mL OMNIPAQUE IOHEXOL 300 MG/ML  SOLN COMPARISON:  July 04, 2010 FINDINGS: Lower chest: No acute abnormality. Hepatobiliary: There is diffuse fatty infiltration of the liver parenchyma. No focal liver abnormality is seen. Status post cholecystectomy. No biliary dilatation. Pancreas: Unremarkable. No pancreatic ductal dilatation or surrounding inflammatory changes. Spleen: Normal in size without focal abnormality. Adrenals/Urinary Tract: Adrenal glands are unremarkable. Kidneys are normal in size, without renal calculi or hydronephrosis. Cortical scarring is seen along the anterolateral aspect of the mid left kidney. Bladder is unremarkable. Stomach/Bowel: Stomach is within normal limits. Appendix appears normal. No evidence of bowel wall thickening, distention, or inflammatory changes. Vascular/Lymphatic: No significant vascular findings are present. No enlarged abdominal or pelvic lymph nodes. Reproductive: An IUD is seen within an otherwise normal appearing uterus. Other: An 11.3 cm x 5.9 cm x 11.7 cm area of heterogeneous increased attenuation is seen within the region of the right rectus sheath. Asymmetric hypertrophy of the adjacent anterior abdominal wall musculature is noted. Musculoskeletal: No acute or significant osseous findings. IMPRESSION: 1. Large right rectus sheath hematoma without evidence to suggest active bleeding. 2. Hepatic steatosis. 3. Evidence of prior cholecystectomy. Electronically Signed   By: Virgina Norfolk M.D.   On: 01/25/2020 22:22     Scheduled Meds: . atorvastatin  20 mg Oral q1800  . benzonatate  200 mg Oral TID  . lisinopril  5 mg Oral Daily  . pantoprazole  40 mg Oral Daily   Continuous Infusions: . sodium chloride 125 mL/hr at 01/26/20 0133     LOS: 0 days    Time spent: 40  Nita Sells, MD [facility] To contact the attending provider between 7A-7P or the covering provider during after hours 7P-7A, please log into the web site [email or web address] and access using universal [place] password for that web site. If you do not have the password, please call the hospital operator.  01/26/2020, 8:24 AM

## 2020-01-26 NOTE — ED Notes (Signed)
Attempted to call floor again, will call back.

## 2020-01-26 NOTE — H&P (Signed)
History and Physical    [name] [name] YNW:[id number] DOB: 21-Sep-1970 DOA: 01/25/2020  PCP: Leighton Ruff, MD   Patient coming from: Home   Chief Complaint: Abdominal pain   HPI: [name] [name] is a 50 y.o. female with medical history significant for anxiety, type 2 diabetes mellitus, hypertension, and recent COVID-19 diagnosis, now presenting to emergency department for evaluation of abdominal pain.  Patient reports that she developed fevers, chills, nonproductive cough, and sinus congestion on 01/18/2020, was diagnosed with COVID-19 2 days later, has experienced resolution of fevers and congestion but has persistent cough.  She developed right lower quadrant abdominal pain on 01/20/2020 during a coughing fit and then experienced acute worsening in pain that began to involve the right upper quadrant as well after coughing on 01/25/2020.  She denies any fevers, nausea, vomiting, diarrhea, or urinary symptoms.  She denies any change in her chronic leg swelling and denies chest pain.  ED Course: Upon arrival to the ED, patient is found to be afebrile, saturating mid 90s on room air, tachycardic in the 110s, and with stable blood pressure.  Chemistry panel is notable for glucose 219 and CBC is unremarkable.  Lipase is normal.  CT of the abdomen and pelvis demonstrates large right rectus sheath hematoma without evidence for active bleeding.  The patient was given a liter of saline, Dilaudid, and Zofran in the ED.  Review of Systems:  All other systems reviewed and apart from HPI, are negative.  Past Medical History:  Diagnosis Date  . Abdominal pain   . Bronchitis   . Complication of anesthesia    hard to wake up  . Diabetes mellitus   . Nipple discharge   . Pneumonia     Past Surgical History:  Procedure Laterality Date  . CHOLECYSTECTOMY    . COLONOSCOPY WITH PROPOFOL N/A 05/07/2014   Procedure: COLONOSCOPY WITH PROPOFOL;  Surgeon: Arta Silence, MD;  Location: [facility];   Service: Endoscopy;  Laterality: N/A;  . corrective leg surgery    . DILATION AND CURETTAGE OF UTERUS    . GANGLION CYST EXCISION     right wrist  . ganglion cyst removed    . LEG SURGERY     corrective surgery to right leg x 2  . TONSILECTOMY, ADENOIDECTOMY, BILATERAL MYRINGOTOMY AND TUBES    . TONSILLECTOMY     age 82    Social History:   reports that she has been smoking cigarettes. She has been smoking about 0.50 packs per day. She has never used smokeless tobacco. She reports current alcohol use of about 2.0 standard drinks of alcohol per week. She reports that she does not use drugs.  Allergies  Allergen Reactions  . Musk     Bronchial spasms  . Other     Fresh cut grass and bleach -bronchial spasms    Family History  Adopted: Yes     Prior to Admission medications   Medication Sig Start Date End Date Taking? Authorizing Provider  ALPRAZolam (XANAX) 0.25 MG tablet Take 0.25 mg by mouth 2 (two) times daily as needed for anxiety. 04/02/19  Yes [provider]  atorvastatin (LIPITOR) 20 MG tablet Take 20 mg by mouth daily. 04/02/19  Yes [provider]  cholecalciferol (VITAMIN D3) 25 MCG (1000 UNIT) tablet Take 2,000 Units by mouth every evening.   Yes [provider]  cholestyramine (QUESTRAN) 4 g packet Take 4 g by mouth daily. 01/22/20  Yes [provider]  ibuprofen (ADVIL,MOTRIN) 200  MG tablet Take 200-800 mg by mouth every 6 (six) hours as needed for moderate pain (Pain).   Yes [provider]  lisinopril (ZESTRIL) 5 MG tablet Take 5 mg by mouth daily. 12/08/19  Yes [provider]  metFORMIN (GLUCOPHAGE-XR) 500 MG 24 hr tablet Take 500 mg by mouth 2 (two) times daily.   Yes [provider]  omeprazole (PRILOSEC) 20 MG capsule Take 40 mg by mouth daily. 04/02/19  Yes [provider]  Probiotic Product (RESTORA PO) Take 1 tablet by mouth every evening.   Yes [provider]  albuterol  (PROVENTIL HFA;VENTOLIN HFA) 108 (90 BASE) MCG/ACT inhaler Inhale 1 puff into the lungs every 6 (six) hours as needed for wheezing or shortness of breath.    [provider]    Physical Exam: Vitals:   01/25/20 2006 01/25/20 2100 01/25/20 2130 01/25/20 2214  BP: (!) 140/92 122/79 128/81 104/62  Pulse: (!) 117 88 90 91  Resp: 14 19 (!) 22 20  Temp: 98.3 F (36.8 C)     TempSrc: Oral     SpO2: 96% 92%  95%  Weight: 132.9 kg     Height: 5\' 4"  (1.626 m)       Constitutional: NAD, calm  Eyes: PERTLA, lids and conjunctivae normal ENMT: Mucous membranes are moist. Posterior pharynx clear of any exudate or lesions.   Neck: normal, supple, no masses, no thyromegaly Respiratory: no wheezing, no crackles. No accessory muscle use.  Cardiovascular: S1 & S2 heard, regular rate and rhythm. No significant JVD. Abdomen: Tender firm mass on right, no rebound pain or guarding. Bowel sounds active.  Musculoskeletal: no clubbing / cyanosis. No joint deformity upper and lower extremities.   Skin: Lower leg erythema. Warm, dry, well-perfused. Neurologic: CN 2-12 grossly intact. Sensation intact. Moving all extremities.  Psychiatric: Alert and oriented to person, place, and situation. Pleasant and cooperative.    Labs and Imaging on Admission: I have personally reviewed following labs and imaging studies  CBC: Recent Labs  Lab 01/25/20 2018  WBC 5.4  NEUTROABS 3.9  HGB 13.9  HCT 42.3  MCV 82.0  PLT 324   Basic Metabolic Panel: Recent Labs  Lab 01/25/20 2018  NA 137  K 3.9  CL 100  CO2 28  GLUCOSE 218*  BUN 11  CREATININE 0.70  CALCIUM 8.7*   GFR: Estimated Creatinine Clearance: 111.7 mL/min (by C-G formula based on SCr of 0.7 mg/dL). Liver Function Tests: Recent Labs  Lab 01/25/20 2018  AST 26  ALT 27  ALKPHOS 75  BILITOT 0.4  PROT 7.4  ALBUMIN 3.6   Recent Labs  Lab 01/25/20 2018  LIPASE 33   No results for input(s): AMMONIA in the last 168  hours. Coagulation Profile: No results for input(s): INR, PROTIME in the last 168 hours. Cardiac Enzymes: No results for input(s): CKTOTAL, CKMB, CKMBINDEX, TROPONINI in the last 168 hours. BNP (last 3 results) No results for input(s): PROBNP in the last 8760 hours. HbA1C: No results for input(s): HGBA1C in the last 72 hours. CBG: No results for input(s): GLUCAP in the last 168 hours. Lipid Profile: No results for input(s): CHOL, HDL, LDLCALC, TRIG, CHOLHDL, LDLDIRECT in the last 72 hours. Thyroid Function Tests: No results for input(s): TSH, T4TOTAL, FREET4, T3FREE, THYROIDAB in the last 72 hours. Anemia Panel: No results for input(s): VITAMINB12, FOLATE, FERRITIN, TIBC, IRON, RETICCTPCT in the last 72 hours. Urine analysis:    Component Value Date/Time   COLORURINE YELLOW 07/03/2010 2205  APPEARANCEUR CLOUDY (A) 07/03/2010 2205   LABSPEC 1.019 07/03/2010 2205   PHURINE 6.0 07/03/2010 2205   GLUCOSEU NEGATIVE 07/03/2010 2205   HGBUR LARGE (A) 07/03/2010 2205   BILIRUBINUR NEGATIVE 07/03/2010 2205   KETONESUR 40 (A) 07/03/2010 2205   PROTEINUR NEGATIVE 07/03/2010 2205   UROBILINOGEN 0.2 07/03/2010 2205   NITRITE NEGATIVE 07/03/2010 2205   LEUKOCYTESUR SMALL (A) 07/03/2010 2205   Sepsis Labs: @LABRCNTIP (procalcitonin:4,lacticidven:4) ) Recent Results (from the past 240 hour(s))  SARS Coronavirus 2 (TAT 6-24 hrs)     Status: Abnormal   Collection Time: 01/20/20 12:00 AM  Result Value Ref Range Status   SARS Coronavirus 2 RESULT: POSITIVE (A)  Final    Comment: RESULT: POSITIVESARS-CoV-2 INTERPRETATION:A POSITIVE  test result means that SARS-CoV-2 RNA was present in the specimen above the limit of detection of this test. The presence of SARS-CoV-2 RNA is indicative of infection with SARS-CoV-2. Detection of  SARS-CoV-2 RNA may not rule-out bacterial infection or co-infection with other viruses. Positive and negative predictive values of testing are highly dependent on  prevalence. False positive test results are more likely when prevalence of disease is  low.The expected result is NEGATIVE.Fact Sheet for Healthcare Providers: SisterViews.hu Sheet for Patients: SwimConditioning.hu Reference Range - Negative      Radiological Exams on Admission: CT Abdomen Pelvis W Contrast  Result Date: 01/25/2020 CLINICAL DATA:  COVID positive.  Right lower quadrant pain. EXAM: CT ABDOMEN AND PELVIS WITH CONTRAST TECHNIQUE: Multidetector CT imaging of the abdomen and pelvis was performed using the standard protocol following bolus administration of intravenous contrast. CONTRAST:  177mL OMNIPAQUE IOHEXOL 300 MG/ML  SOLN COMPARISON:  July 04, 2010 FINDINGS: Lower chest: No acute abnormality. Hepatobiliary: There is diffuse fatty infiltration of the liver parenchyma. No focal liver abnormality is seen. Status post cholecystectomy. No biliary dilatation. Pancreas: Unremarkable. No pancreatic ductal dilatation or surrounding inflammatory changes. Spleen: Normal in size without focal abnormality. Adrenals/Urinary Tract: Adrenal glands are unremarkable. Kidneys are normal in size, without renal calculi or hydronephrosis. Cortical scarring is seen along the anterolateral aspect of the mid left kidney. Bladder is unremarkable. Stomach/Bowel: Stomach is within normal limits. Appendix appears normal. No evidence of bowel wall thickening, distention, or inflammatory changes. Vascular/Lymphatic: No significant vascular findings are present. No enlarged abdominal or pelvic lymph nodes. Reproductive: An IUD is seen within an otherwise normal appearing uterus. Other: An 11.3 cm x 5.9 cm x 11.7 cm area of heterogeneous increased attenuation is seen within the region of the right rectus sheath. Asymmetric hypertrophy of the adjacent anterior abdominal wall musculature is noted. Musculoskeletal: No acute or significant osseous findings. IMPRESSION:  1. Large right rectus sheath hematoma without evidence to suggest active bleeding. 2. Hepatic steatosis. 3. Evidence of prior cholecystectomy. Electronically Signed   By: Virgina Norfolk M.D.   On: 01/25/2020 22:22    Assessment/Plan   1. Rectus sheath hematoma  - Presents with severe right-sided abdominal pain that began during a coughing fit on 1/10 and then worsened acutely after a cough on 1/15  - CT reveals large rectus sheath hematoma without evidence for active bleeding  - H&H are normal, she was mildly tachycardic initially, and HR normalized with IVF  - Type and screen, suppress cough, monitor H&H, continue pain-control and IVF hydration   2. COVID-19 infection  - Patient is vaccinated, developed sinus congestion, fever, and cough on 1/8, tested positive for COVID on 01/20/20, is no longer having fevers or congestion, denies SOB, but frequent non-productive cough continues  -  Continue supportive care and isolation   3. History of DVT  - Was anticoagulated until October 2021 after LLE DVT last March that was felt to provoked by immobility, no evidence for acute VTE    4. Type II DM  - Update A1c, check CBGs, use low-intensity SSI     5. Anxiety  - Continue as-needed Xanax     DVT prophylaxis: SCDs Code Status: Full  Family Communication: Discussed with patient  Disposition Plan:  Patient is from: Home  Anticipated d/c is to: Home  Anticipated d/c date is: 1/16 or 01/27/20 Patient currently: Pending stable hemodynamics, pain-control, no active bleeding  Consults called: None  Admission status: Observation     Vianne Bulls, MD [facility]  01/26/2020, 12:36 AM

## 2020-01-27 DIAGNOSIS — S301XXA Contusion of abdominal wall, initial encounter: Secondary | ICD-10-CM | POA: Diagnosis not present

## 2020-01-27 LAB — CBC WITH DIFFERENTIAL/PLATELET
Abs Immature Granulocytes: 0.04 10*3/uL (ref 0.00–0.07)
Basophils Absolute: 0 10*3/uL (ref 0.0–0.1)
Basophils Relative: 1 %
Eosinophils Absolute: 0.1 10*3/uL (ref 0.0–0.5)
Eosinophils Relative: 2 %
HCT: 35.1 % — ABNORMAL LOW (ref 36.0–46.0)
Hemoglobin: 11.1 g/dL — ABNORMAL LOW (ref 12.0–15.0)
Immature Granulocytes: 1 %
Lymphocytes Relative: 29 %
Lymphs Abs: 1.2 10*3/uL (ref 0.7–4.0)
MCH: 26.9 pg (ref 26.0–34.0)
MCHC: 31.6 g/dL (ref 30.0–36.0)
MCV: 85.2 fL (ref 80.0–100.0)
Monocytes Absolute: 0.3 10*3/uL (ref 0.1–1.0)
Monocytes Relative: 7 %
Neutro Abs: 2.6 10*3/uL (ref 1.7–7.7)
Neutrophils Relative %: 60 %
Platelets: 219 10*3/uL (ref 150–400)
RBC: 4.12 MIL/uL (ref 3.87–5.11)
RDW: 13.2 % (ref 11.5–15.5)
WBC: 4.3 10*3/uL (ref 4.0–10.5)
nRBC: 0 % (ref 0.0–0.2)

## 2020-01-27 LAB — BASIC METABOLIC PANEL
Anion gap: 10 (ref 5–15)
BUN: 12 mg/dL (ref 6–20)
CO2: 27 mmol/L (ref 22–32)
Calcium: 8 mg/dL — ABNORMAL LOW (ref 8.9–10.3)
Chloride: 100 mmol/L (ref 98–111)
Creatinine, Ser: 0.71 mg/dL (ref 0.44–1.00)
GFR, Estimated: >60 mL/min (ref >60)
Glucose, Bld: 143 mg/dL — ABNORMAL HIGH (ref 70–99)
Potassium: 3.9 mmol/L (ref 3.5–5.1)
Sodium: 137 mmol/L (ref 135–145)

## 2020-01-27 MED ORDER — HYDROCOD POLST-CPM POLST ER 10-8 MG/5ML PO SUER: 5.0000 mL | Freq: Four times a day (QID) | ORAL | 0 refills | Status: AC

## 2020-01-27 MED ORDER — OXYCODONE HCL 5 MG PO TABS: 5.0000 mg | ORAL_TABLET | Freq: Four times a day (QID) | ORAL | 0 refills | Status: AC | PRN

## 2020-01-27 MED ORDER — GUAIFENESIN-DM 100-10 MG/5ML PO SYRP: 5.0000 mL | ORAL_SOLUTION | ORAL | 0 refills | Status: AC | PRN

## 2020-01-27 NOTE — Progress Notes (Signed)
This nurse went over d/c instructions.  Patient verbalized understanding.  Patient left with all belongings, chargers, paperwork and scripts.  Virginia Rochester, RN

## 2020-01-27 NOTE — Discharge Summary (Signed)
Physician Discharge Summary  [name] [name] NFA:[id number] DOB: [birth year] DOA: 01/25/2020  PCP: Leighton Ruff, MD  Admit date: 01/25/2020 Discharge date: 01/27/2020  Time spent: 27 minutes  Recommendations for Outpatient Follow-up:  1. Limited prescription Tussidex and Oxy IR given for cough suppression and abdominal pain respectively 2. Needs recheck CBC in 1 week 3. Consider testing for sleep apnea and NAFLD in the outpatient setting  Discharge Diagnoses:  Principal Problem:   Rectus sheath hematoma, initial encounter Active Problems:   Diabetes mellitus type II, non insulin dependent ([place])   COVID-19 virus infection   Anxiety disorder   History of deep vein thrombosis (DVT) of lower extremity   Rectus sheath hematoma   Discharge Condition: Improved  Diet recommendation: Heart healthy  Filed Weights   01/25/20 2006  Weight: 132.9 kg    History of present illness:  50 year old white female Underlying COPD, DM TY 2, prior tobacco, BMI 50, DVT 03/23/2018 with extensive left femoral-popliteal and tibial vein clot placed on Xarelto for 6 months then discontinued Diagnosed with COVID 01/19/2018 1:22 day of symptoms right lower quadrant pain which progressed and got worse Came to the ED 1/15-she had a temperature palpable mass to right side-CT showed large rectus sheath hematoma 11 X6X 11 without active bleeding No longer having active symptoms of shortness of breath etc. at this time Pain was manageable on Tylenol first choice Oxy IR second choice We gave a suppressant with Hycodan syrup and prescribed prescriptions for the same Her COVID was very asymptomatic with minimal symptoms I advised her to stop her ibuprofen as this can worsen risk of bleeding-she was able to discharge home without any further issues her hemoglobin stabilized in the 11-12 range she will need a repeat in 1 week  Discharge Exam: Vitals:   01/26/20 2122 01/27/20 0545  BP: (!) 112/59 (!) 91/37   Pulse: 79 76  Resp: 17 15  Temp: 98.8 F (37.1 C) 98.4 F (36.9 C)  SpO2: 95% 98%    General: Awake alert pleasant no distress EOMI NCAT no focal deficit Cardiovascular: S1-S2 no murmur no rub no gallop Respiratory: Clinically clear no added sound no wheeze no rales no rhonchi Abdomen soft slight tenderness right lower quadrant Neurologically intact no focal deficit  Discharge Instructions   Discharge Instructions    Diet - low sodium heart healthy   Complete by: As directed    Discharge instructions   Complete by: As directed    Take Tylenol as first choice for pain around-the-clock 3 times a day 500 mg or 650 and you can take oxycodone 5 mg as needed every 6 hourly We will prescribe a cough suppressant Robitussin which can be used every 4 hours you can also take Tussionex which has a little bit of hydrocodone which will help suppress the cough even more although this may cause you to have constipation as all opiates do-please keep a watch on that Do not use ibuprofen Get lab work at your primary physician office in about 1 to 2 weeks to make sure that your hemoglobin stabilizes Please isolate yourself until 1/20 which is 10 days out from  your diagnosis of coronavirus   Increase activity slowly   Complete by: As directed      Allergies as of 01/27/2020      Reactions   Musk    Bronchial spasms   Other    Fresh cut grass and bleach -bronchial spasms      Medication List  STOP taking these medications   ibuprofen 200 MG tablet Commonly known as: ADVIL     TAKE these medications   albuterol 108 (90 Base) MCG/ACT inhaler Commonly known as: VENTOLIN HFA Inhale 1 puff into the lungs every 6 (six) hours as needed for wheezing or shortness of breath.   ALPRAZolam 0.25 MG tablet Commonly known as: XANAX Take 0.25 mg by mouth 2 (two) times daily as needed for anxiety.   atorvastatin 20 MG tablet Commonly known as: LIPITOR Take 20 mg by mouth daily.    chlorpheniramine-HYDROcodone 10-8 MG/5ML Suer Commonly known as: TUSSIONEX Take 5 mLs by mouth every 6 (six) hours.   cholecalciferol 25 MCG (1000 UNIT) tablet Commonly known as: VITAMIN D3 Take 2,000 Units by mouth every evening.   cholestyramine 4 g packet Commonly known as: QUESTRAN Take 4 g by mouth daily.   guaiFENesin-dextromethorphan 100-10 MG/5ML syrup Commonly known as: ROBITUSSIN DM Take 5 mLs by mouth every 4 (four) hours as needed for cough.   lisinopril 5 MG tablet Commonly known as: ZESTRIL Take 5 mg by mouth daily.   metFORMIN 500 MG 24 hr tablet Commonly known as: GLUCOPHAGE-XR Take 500 mg by mouth 2 (two) times daily.   omeprazole 20 MG capsule Commonly known as: PRILOSEC Take 40 mg by mouth daily.   oxyCODONE 5 MG immediate release tablet Commonly known as: Oxy IR/ROXICODONE Take 1 tablet (5 mg total) by mouth every 6 (six) hours as needed for moderate pain.   RESTORA PO Take 1 tablet by mouth every evening.      Allergies  Allergen Reactions  . Musk     Bronchial spasms  . Other     Fresh cut grass and bleach -bronchial spasms      The results of significant diagnostics from this hospitalization (including imaging, microbiology, ancillary and laboratory) are listed below for reference.    Significant Diagnostic Studies: CT Abdomen Pelvis W Contrast  Result Date: 01/25/2020 CLINICAL DATA:  COVID positive.  Right lower quadrant pain. EXAM: CT ABDOMEN AND PELVIS WITH CONTRAST TECHNIQUE: Multidetector CT imaging of the abdomen and pelvis was performed using the standard protocol following bolus administration of intravenous contrast. CONTRAST:  170mL OMNIPAQUE IOHEXOL 300 MG/ML  SOLN COMPARISON:  July 04, 2010 FINDINGS: Lower chest: No acute abnormality. Hepatobiliary: There is diffuse fatty infiltration of the liver parenchyma. No focal liver abnormality is seen. Status post cholecystectomy. No biliary dilatation. Pancreas: Unremarkable. No  pancreatic ductal dilatation or surrounding inflammatory changes. Spleen: Normal in size without focal abnormality. Adrenals/Urinary Tract: Adrenal glands are unremarkable. Kidneys are normal in size, without renal calculi or hydronephrosis. Cortical scarring is seen along the anterolateral aspect of the mid left kidney. Bladder is unremarkable. Stomach/Bowel: Stomach is within normal limits. Appendix appears normal. No evidence of bowel wall thickening, distention, or inflammatory changes. Vascular/Lymphatic: No significant vascular findings are present. No enlarged abdominal or pelvic lymph nodes. Reproductive: An IUD is seen within an otherwise normal appearing uterus. Other: An 11.3 cm x 5.9 cm x 11.7 cm area of heterogeneous increased attenuation is seen within the region of the right rectus sheath. Asymmetric hypertrophy of the adjacent anterior abdominal wall musculature is noted. Musculoskeletal: No acute or significant osseous findings. IMPRESSION: 1. Large right rectus sheath hematoma without evidence to suggest active bleeding. 2. Hepatic steatosis. 3. Evidence of prior cholecystectomy. Electronically Signed   By: Virgina Norfolk M.D.   On: 01/25/2020 22:22   US Abdomen Limited RUQ (LIVER/GB)  Result Date: 01/08/2020 CLINICAL  DATA:  Recurrent upper abdominal pain EXAM: ULTRASOUND ABDOMEN LIMITED RIGHT UPPER QUADRANT COMPARISON:  07/04/2010 and prior. FINDINGS: Gallbladder: Surgically absent. Common bile duct: Diameter: 8.1 mm. Liver: No focal lesion identified. Increased parenchymal echogenicity. Portal vein is patent on color Doppler imaging with normal direction of blood flow towards the liver. Other: None. IMPRESSION: Hepatic steatosis.  No focal hepatic lesion. Post cholecystectomy sequela. Electronically Signed   By: Primitivo Gauze M.D.   On: 01/08/2020 12:18    Microbiology: Recent Results (from the past 240 hour(s))  SARS Coronavirus 2 (TAT 6-24 hrs)     Status: Abnormal    Collection Time: 01/20/20 12:00 AM  Result Value Ref Range Status   SARS Coronavirus 2 RESULT: POSITIVE (A)  Final    Comment: RESULT: POSITIVESARS-CoV-2 INTERPRETATION:A POSITIVE  test result means that SARS-CoV-2 RNA was present in the specimen above the limit of detection of this test. The presence of SARS-CoV-2 RNA is indicative of infection with SARS-CoV-2. Detection of  SARS-CoV-2 RNA may not rule-out bacterial infection or co-infection with other viruses. Positive and negative predictive values of testing are highly dependent on prevalence. False positive test results are more likely when prevalence of disease is  low.The expected result is NEGATIVE.Fact Sheet for Healthcare Providers: SisterViews.hu Sheet for Patients: SwimConditioning.hu Reference Range - Negative      Labs: Basic Metabolic Panel: Recent Labs  Lab 01/25/20 2018 01/26/20 0500 01/27/20 0235  NA 137 137 137  K 3.9 3.9 3.9  CL 100 99 100  CO2 28 27 27   GLUCOSE 218* 188* 143*  BUN 11 12 12   CREATININE 0.70 0.69 0.71  CALCIUM 8.7* 8.7* 8.0*   Liver Function Tests: Recent Labs  Lab 01/25/20 2018  AST 26  ALT 27  ALKPHOS 75  BILITOT 0.4  PROT 7.4  ALBUMIN 3.6   Recent Labs  Lab 01/25/20 2018  LIPASE 33   No results for input(s): AMMONIA in the last 168 hours. CBC: Recent Labs  Lab 01/25/20 2018 01/26/20 0500 01/27/20 0235  WBC 5.4 5.8 4.3  NEUTROABS 3.9  --  2.6  HGB 13.9 12.9 11.1*  HCT 42.3 39.1 35.1*  MCV 82.0 81.8 85.2  PLT 248 260 219   Cardiac Enzymes: No results for input(s): CKTOTAL, CKMB, CKMBINDEX, TROPONINI in the last 168 hours. BNP: BNP (last 3 results) No results for input(s): BNP in the last 8760 hours.  ProBNP (last 3 results) No results for input(s): PROBNP in the last 8760 hours.  CBG: No results for input(s): GLUCAP in the last 168 hours.     Signed:  Nita Sells MD   [facility] 01/27/2020, 9:20 AM

## 2020-01-27 NOTE — Plan of Care (Signed)

## 2020-01-30 DIAGNOSIS — D649 Anemia, unspecified: Secondary | ICD-10-CM | POA: Diagnosis not present

## 2020-01-30 DIAGNOSIS — S301XXD Contusion of abdominal wall, subsequent encounter: Secondary | ICD-10-CM | POA: Diagnosis not present

## 2020-01-30 DIAGNOSIS — R531 Weakness: Secondary | ICD-10-CM | POA: Diagnosis not present

## 2020-02-03 DIAGNOSIS — E559 Vitamin D deficiency, unspecified: Secondary | ICD-10-CM | POA: Diagnosis not present

## 2020-02-03 DIAGNOSIS — D62 Acute posthemorrhagic anemia: Secondary | ICD-10-CM | POA: Diagnosis not present

## 2020-02-03 DIAGNOSIS — S301XXD Contusion of abdominal wall, subsequent encounter: Secondary | ICD-10-CM | POA: Diagnosis not present

## 2020-02-03 DIAGNOSIS — R1010 Upper abdominal pain, unspecified: Secondary | ICD-10-CM | POA: Diagnosis not present

## 2020-02-03 DIAGNOSIS — E118 Type 2 diabetes mellitus with unspecified complications: Secondary | ICD-10-CM | POA: Diagnosis not present

## 2020-02-17 ENCOUNTER — Other Ambulatory Visit (HOSPITAL_COMMUNITY): Payer: Self-pay | Admitting: Family Medicine

## 2020-02-17 DIAGNOSIS — U071 COVID-19: Secondary | ICD-10-CM | POA: Diagnosis not present

## 2020-02-17 DIAGNOSIS — S301XXD Contusion of abdominal wall, subsequent encounter: Secondary | ICD-10-CM | POA: Diagnosis not present

## 2020-02-17 DIAGNOSIS — R52 Pain, unspecified: Secondary | ICD-10-CM

## 2020-02-17 DIAGNOSIS — R059 Cough, unspecified: Secondary | ICD-10-CM | POA: Diagnosis not present

## 2020-02-17 DIAGNOSIS — E611 Iron deficiency: Secondary | ICD-10-CM | POA: Diagnosis not present

## 2020-02-17 DIAGNOSIS — M79605 Pain in left leg: Secondary | ICD-10-CM | POA: Diagnosis not present

## 2020-02-18 ENCOUNTER — Other Ambulatory Visit: Payer: Self-pay

## 2020-02-18 ENCOUNTER — Ambulatory Visit (HOSPITAL_COMMUNITY)
Admission: RE | Admit: 2020-02-18 | Discharge: 2020-02-18 | Disposition: A | Discharge status: Home / Self Care | Payer: BC Managed Care – PPO | Source: Ambulatory Visit | Attending: Family Medicine | Admitting: Family Medicine

## 2020-02-18 DIAGNOSIS — R52 Pain, unspecified: Secondary | ICD-10-CM | POA: Insufficient documentation

## 2020-02-18 NOTE — Progress Notes (Signed)
Left lower extremity venous duplex has been completed. Preliminary results can be found in CV Proc through chart review.  Results were given to [facility] at Dr. Jodi Mourning office.  02/18/20 10:59 AM Carlos Levering RVT

## 2020-02-26 DIAGNOSIS — R140 Abdominal distension (gaseous): Secondary | ICD-10-CM | POA: Diagnosis not present

## 2020-02-26 DIAGNOSIS — K529 Noninfective gastroenteritis and colitis, unspecified: Secondary | ICD-10-CM | POA: Diagnosis not present

## 2020-02-26 DIAGNOSIS — R1010 Upper abdominal pain, unspecified: Secondary | ICD-10-CM | POA: Diagnosis not present

## 2020-03-11 ENCOUNTER — Ambulatory Visit (HOSPITAL_COMMUNITY): Admit: 2020-03-11 | Payer: BC Managed Care – PPO | Admitting: Gastroenterology

## 2020-03-11 ENCOUNTER — Encounter (HOSPITAL_COMMUNITY): Payer: Self-pay

## 2020-03-11 SURGERY — ESOPHAGOGASTRODUODENOSCOPY (EGD) WITH PROPOFOL
Anesthesia: Monitor Anesthesia Care

## 2020-04-14 DIAGNOSIS — R059 Cough, unspecified: Secondary | ICD-10-CM | POA: Diagnosis not present

## 2020-04-14 DIAGNOSIS — S301XXD Contusion of abdominal wall, subsequent encounter: Secondary | ICD-10-CM | POA: Diagnosis not present

## 2020-04-14 DIAGNOSIS — E611 Iron deficiency: Secondary | ICD-10-CM | POA: Diagnosis not present

## 2020-04-14 DIAGNOSIS — U071 COVID-19: Secondary | ICD-10-CM | POA: Diagnosis not present

## 2020-05-28 DIAGNOSIS — G4700 Insomnia, unspecified: Secondary | ICD-10-CM | POA: Diagnosis not present

## 2020-05-28 DIAGNOSIS — E118 Type 2 diabetes mellitus with unspecified complications: Secondary | ICD-10-CM | POA: Diagnosis not present

## 2020-05-28 DIAGNOSIS — F321 Major depressive disorder, single episode, moderate: Secondary | ICD-10-CM | POA: Diagnosis not present

## 2020-05-28 DIAGNOSIS — E611 Iron deficiency: Secondary | ICD-10-CM | POA: Diagnosis not present

## 2020-05-28 DIAGNOSIS — S301XXD Contusion of abdominal wall, subsequent encounter: Secondary | ICD-10-CM | POA: Diagnosis not present

## 2020-05-28 DIAGNOSIS — E559 Vitamin D deficiency, unspecified: Secondary | ICD-10-CM | POA: Diagnosis not present

## 2020-08-06 DIAGNOSIS — S301XXD Contusion of abdominal wall, subsequent encounter: Secondary | ICD-10-CM | POA: Diagnosis not present

## 2020-08-06 DIAGNOSIS — R0781 Pleurodynia: Secondary | ICD-10-CM | POA: Diagnosis not present

## 2020-08-10 ENCOUNTER — Other Ambulatory Visit: Payer: Self-pay | Admitting: Family Medicine

## 2020-08-10 DIAGNOSIS — S301XXD Contusion of abdominal wall, subsequent encounter: Secondary | ICD-10-CM

## 2020-08-10 DIAGNOSIS — R1031 Right lower quadrant pain: Secondary | ICD-10-CM

## 2020-08-13 ENCOUNTER — Other Ambulatory Visit: Payer: Self-pay

## 2020-08-13 ENCOUNTER — Ambulatory Visit
Admission: RE | Admit: 2020-08-13 | Discharge: 2020-08-13 | Disposition: A | Discharge status: Home / Self Care | Payer: BC Managed Care – PPO | Source: Ambulatory Visit | Attending: Family Medicine | Admitting: Family Medicine

## 2020-08-13 DIAGNOSIS — R1031 Right lower quadrant pain: Secondary | ICD-10-CM | POA: Diagnosis not present

## 2020-08-13 DIAGNOSIS — S301XXD Contusion of abdominal wall, subsequent encounter: Secondary | ICD-10-CM

## 2020-08-13 MED ORDER — IOPAMIDOL (ISOVUE-300) INJECTION 61%
100.0000 mL | Freq: Once | INTRAVENOUS | Status: AC | PRN
  Administered 2020-08-13: 100 mL via INTRAVENOUS

## 2020-09-25 DIAGNOSIS — Z20822 Contact with and (suspected) exposure to covid-19: Secondary | ICD-10-CM | POA: Diagnosis not present

## 2020-09-25 DIAGNOSIS — R059 Cough, unspecified: Secondary | ICD-10-CM | POA: Diagnosis not present

## 2020-12-10 DIAGNOSIS — E559 Vitamin D deficiency, unspecified: Secondary | ICD-10-CM | POA: Diagnosis not present

## 2020-12-10 DIAGNOSIS — K219 Gastro-esophageal reflux disease without esophagitis: Secondary | ICD-10-CM | POA: Diagnosis not present

## 2020-12-10 DIAGNOSIS — E118 Type 2 diabetes mellitus with unspecified complications: Secondary | ICD-10-CM | POA: Diagnosis not present

## 2020-12-10 DIAGNOSIS — R059 Cough, unspecified: Secondary | ICD-10-CM | POA: Diagnosis not present

## 2020-12-25 DIAGNOSIS — J22 Unspecified acute lower respiratory infection: Secondary | ICD-10-CM | POA: Diagnosis not present

## 2021-04-06 DIAGNOSIS — R059 Cough, unspecified: Secondary | ICD-10-CM | POA: Diagnosis not present

## 2021-04-06 DIAGNOSIS — J019 Acute sinusitis, unspecified: Secondary | ICD-10-CM | POA: Diagnosis not present

## 2021-04-06 DIAGNOSIS — J22 Unspecified acute lower respiratory infection: Secondary | ICD-10-CM | POA: Diagnosis not present

## 2021-06-04 DIAGNOSIS — J189 Pneumonia, unspecified organism: Secondary | ICD-10-CM | POA: Diagnosis not present

## 2021-06-04 DIAGNOSIS — R059 Cough, unspecified: Secondary | ICD-10-CM | POA: Diagnosis not present

## 2021-06-04 DIAGNOSIS — R062 Wheezing: Secondary | ICD-10-CM | POA: Diagnosis not present

## 2021-06-04 DIAGNOSIS — R0602 Shortness of breath: Secondary | ICD-10-CM | POA: Diagnosis not present

## 2021-06-09 DIAGNOSIS — Z0000 Encounter for general adult medical examination without abnormal findings: Secondary | ICD-10-CM | POA: Diagnosis not present

## 2021-06-09 DIAGNOSIS — E118 Type 2 diabetes mellitus with unspecified complications: Secondary | ICD-10-CM | POA: Diagnosis not present

## 2021-06-09 DIAGNOSIS — E559 Vitamin D deficiency, unspecified: Secondary | ICD-10-CM | POA: Diagnosis not present

## 2021-06-09 DIAGNOSIS — E785 Hyperlipidemia, unspecified: Secondary | ICD-10-CM | POA: Diagnosis not present

## 2021-12-24 DIAGNOSIS — R059 Cough, unspecified: Secondary | ICD-10-CM | POA: Diagnosis not present

## 2021-12-24 DIAGNOSIS — J45901 Unspecified asthma with (acute) exacerbation: Secondary | ICD-10-CM | POA: Diagnosis not present

## 2022-06-15 DIAGNOSIS — E559 Vitamin D deficiency, unspecified: Secondary | ICD-10-CM | POA: Diagnosis not present

## 2022-06-15 DIAGNOSIS — E785 Hyperlipidemia, unspecified: Secondary | ICD-10-CM | POA: Diagnosis not present

## 2022-06-15 DIAGNOSIS — K219 Gastro-esophageal reflux disease without esophagitis: Secondary | ICD-10-CM | POA: Diagnosis not present

## 2022-06-15 DIAGNOSIS — E1165 Type 2 diabetes mellitus with hyperglycemia: Secondary | ICD-10-CM | POA: Diagnosis not present

## 2022-07-12 DIAGNOSIS — R3989 Other symptoms and signs involving the genitourinary system: Secondary | ICD-10-CM | POA: Diagnosis not present

## 2022-07-12 DIAGNOSIS — N760 Acute vaginitis: Secondary | ICD-10-CM | POA: Diagnosis not present

## 2022-07-12 DIAGNOSIS — Z1151 Encounter for screening for human papillomavirus (HPV): Secondary | ICD-10-CM | POA: Diagnosis not present

## 2022-07-12 DIAGNOSIS — Z124 Encounter for screening for malignant neoplasm of cervix: Secondary | ICD-10-CM | POA: Diagnosis not present

## 2022-07-12 DIAGNOSIS — Z01419 Encounter for gynecological examination (general) (routine) without abnormal findings: Secondary | ICD-10-CM | POA: Diagnosis not present

## 2022-09-27 DIAGNOSIS — E1165 Type 2 diabetes mellitus with hyperglycemia: Secondary | ICD-10-CM | POA: Diagnosis not present

## 2022-09-27 DIAGNOSIS — E785 Hyperlipidemia, unspecified: Secondary | ICD-10-CM | POA: Diagnosis not present

## 2022-09-27 DIAGNOSIS — M549 Dorsalgia, unspecified: Secondary | ICD-10-CM | POA: Diagnosis not present

## 2022-09-27 DIAGNOSIS — E559 Vitamin D deficiency, unspecified: Secondary | ICD-10-CM | POA: Diagnosis not present

## 2022-10-17 DIAGNOSIS — G4733 Obstructive sleep apnea (adult) (pediatric): Secondary | ICD-10-CM | POA: Diagnosis not present

## 2022-11-09 DIAGNOSIS — G4733 Obstructive sleep apnea (adult) (pediatric): Secondary | ICD-10-CM | POA: Diagnosis not present

## 2022-12-10 DIAGNOSIS — G4733 Obstructive sleep apnea (adult) (pediatric): Secondary | ICD-10-CM | POA: Diagnosis not present

## 2022-12-13 DIAGNOSIS — H109 Unspecified conjunctivitis: Secondary | ICD-10-CM | POA: Diagnosis not present

## 2022-12-13 DIAGNOSIS — Z6841 Body Mass Index (BMI) 40.0 and over, adult: Secondary | ICD-10-CM | POA: Diagnosis not present

## 2022-12-13 DIAGNOSIS — J069 Acute upper respiratory infection, unspecified: Secondary | ICD-10-CM | POA: Diagnosis not present

## 2023-01-09 DIAGNOSIS — G4733 Obstructive sleep apnea (adult) (pediatric): Secondary | ICD-10-CM | POA: Diagnosis not present

## 2023-01-17 DIAGNOSIS — E559 Vitamin D deficiency, unspecified: Secondary | ICD-10-CM | POA: Diagnosis not present

## 2023-01-17 DIAGNOSIS — G4733 Obstructive sleep apnea (adult) (pediatric): Secondary | ICD-10-CM | POA: Diagnosis not present

## 2023-01-17 DIAGNOSIS — E1165 Type 2 diabetes mellitus with hyperglycemia: Secondary | ICD-10-CM | POA: Diagnosis not present

## 2023-01-17 DIAGNOSIS — F17200 Nicotine dependence, unspecified, uncomplicated: Secondary | ICD-10-CM | POA: Diagnosis not present

## 2023-01-17 DIAGNOSIS — E1169 Type 2 diabetes mellitus with other specified complication: Secondary | ICD-10-CM | POA: Diagnosis not present

## 2023-01-17 DIAGNOSIS — N951 Menopausal and female climacteric states: Secondary | ICD-10-CM | POA: Diagnosis not present

## 2023-02-09 DIAGNOSIS — G4733 Obstructive sleep apnea (adult) (pediatric): Secondary | ICD-10-CM | POA: Diagnosis not present

## 2023-02-10 DIAGNOSIS — G4733 Obstructive sleep apnea (adult) (pediatric): Secondary | ICD-10-CM | POA: Diagnosis not present

## 2023-02-22 ENCOUNTER — Encounter (HOSPITAL_BASED_OUTPATIENT_CLINIC_OR_DEPARTMENT_OTHER): Payer: Self-pay | Admitting: Emergency Medicine

## 2023-02-22 ENCOUNTER — Emergency Department (HOSPITAL_BASED_OUTPATIENT_CLINIC_OR_DEPARTMENT_OTHER): Payer: BC Managed Care – PPO

## 2023-02-22 ENCOUNTER — Emergency Department (HOSPITAL_BASED_OUTPATIENT_CLINIC_OR_DEPARTMENT_OTHER): Payer: BC Managed Care – PPO | Admitting: Radiology

## 2023-02-22 ENCOUNTER — Inpatient Hospital Stay (HOSPITAL_BASED_OUTPATIENT_CLINIC_OR_DEPARTMENT_OTHER)
Admission: EM | Admit: 2023-02-22 | Discharge: 2023-02-25 | DRG: 193 | Disposition: A | Discharge status: Home / Self Care | Payer: BC Managed Care – PPO | Attending: Internal Medicine | Admitting: Internal Medicine

## 2023-02-22 ENCOUNTER — Other Ambulatory Visit: Payer: Self-pay

## 2023-02-22 DIAGNOSIS — E785 Hyperlipidemia, unspecified: Secondary | ICD-10-CM | POA: Diagnosis not present

## 2023-02-22 DIAGNOSIS — J9601 Acute respiratory failure with hypoxia: Secondary | ICD-10-CM | POA: Diagnosis present

## 2023-02-22 DIAGNOSIS — E876 Hypokalemia: Secondary | ICD-10-CM | POA: Diagnosis not present

## 2023-02-22 DIAGNOSIS — Z720 Tobacco use: Secondary | ICD-10-CM | POA: Diagnosis not present

## 2023-02-22 DIAGNOSIS — Z7984 Long term (current) use of oral hypoglycemic drugs: Secondary | ICD-10-CM

## 2023-02-22 DIAGNOSIS — Z1152 Encounter for screening for COVID-19: Secondary | ICD-10-CM

## 2023-02-22 DIAGNOSIS — Z9049 Acquired absence of other specified parts of digestive tract: Secondary | ICD-10-CM

## 2023-02-22 DIAGNOSIS — R1031 Right lower quadrant pain: Secondary | ICD-10-CM | POA: Diagnosis not present

## 2023-02-22 DIAGNOSIS — J440 Chronic obstructive pulmonary disease with acute lower respiratory infection: Secondary | ICD-10-CM | POA: Diagnosis present

## 2023-02-22 DIAGNOSIS — Z79899 Other long term (current) drug therapy: Secondary | ICD-10-CM | POA: Diagnosis not present

## 2023-02-22 DIAGNOSIS — Z6841 Body Mass Index (BMI) 40.0 and over, adult: Secondary | ICD-10-CM | POA: Diagnosis not present

## 2023-02-22 DIAGNOSIS — F17210 Nicotine dependence, cigarettes, uncomplicated: Secondary | ICD-10-CM | POA: Diagnosis not present

## 2023-02-22 DIAGNOSIS — Z91048 Other nonmedicinal substance allergy status: Secondary | ICD-10-CM

## 2023-02-22 DIAGNOSIS — J101 Influenza due to other identified influenza virus with other respiratory manifestations: Secondary | ICD-10-CM | POA: Diagnosis not present

## 2023-02-22 DIAGNOSIS — J441 Chronic obstructive pulmonary disease with (acute) exacerbation: Secondary | ICD-10-CM | POA: Diagnosis present

## 2023-02-22 DIAGNOSIS — J129 Viral pneumonia, unspecified: Secondary | ICD-10-CM | POA: Diagnosis present

## 2023-02-22 DIAGNOSIS — Z8616 Personal history of COVID-19: Secondary | ICD-10-CM

## 2023-02-22 DIAGNOSIS — Z7985 Long-term (current) use of injectable non-insulin antidiabetic drugs: Secondary | ICD-10-CM | POA: Diagnosis not present

## 2023-02-22 DIAGNOSIS — N3289 Other specified disorders of bladder: Secondary | ICD-10-CM | POA: Diagnosis not present

## 2023-02-22 DIAGNOSIS — I10 Essential (primary) hypertension: Secondary | ICD-10-CM | POA: Diagnosis not present

## 2023-02-22 DIAGNOSIS — J09X1 Influenza due to identified novel influenza A virus with pneumonia: Secondary | ICD-10-CM | POA: Diagnosis not present

## 2023-02-22 DIAGNOSIS — E1165 Type 2 diabetes mellitus with hyperglycemia: Secondary | ICD-10-CM | POA: Diagnosis not present

## 2023-02-22 DIAGNOSIS — R0602 Shortness of breath: Secondary | ICD-10-CM | POA: Diagnosis not present

## 2023-02-22 DIAGNOSIS — K429 Umbilical hernia without obstruction or gangrene: Secondary | ICD-10-CM | POA: Diagnosis not present

## 2023-02-22 DIAGNOSIS — E119 Type 2 diabetes mellitus without complications: Secondary | ICD-10-CM

## 2023-02-22 DIAGNOSIS — Z86718 Personal history of other venous thrombosis and embolism: Secondary | ICD-10-CM

## 2023-02-22 DIAGNOSIS — G4733 Obstructive sleep apnea (adult) (pediatric): Secondary | ICD-10-CM | POA: Diagnosis not present

## 2023-02-22 DIAGNOSIS — J1008 Influenza due to other identified influenza virus with other specified pneumonia: Principal | ICD-10-CM | POA: Diagnosis present

## 2023-02-22 DIAGNOSIS — J189 Pneumonia, unspecified organism: Secondary | ICD-10-CM | POA: Diagnosis not present

## 2023-02-22 DIAGNOSIS — R0902 Hypoxemia: Principal | ICD-10-CM

## 2023-02-22 LAB — COMPREHENSIVE METABOLIC PANEL
ALT: 24 U/L (ref 0–44)
AST: 39 U/L (ref 15–41)
Albumin: 3.6 g/dL (ref 3.5–5.0)
Alkaline Phosphatase: 51 U/L (ref 38–126)
Anion gap: 8 (ref 5–15)
BUN: 6 mg/dL (ref 6–20)
CO2: 29 mmol/L (ref 22–32)
Calcium: 8.2 mg/dL — ABNORMAL LOW (ref 8.9–10.3)
Chloride: 99 mmol/L (ref 98–111)
Creatinine, Ser: 0.5 mg/dL (ref 0.44–1.00)
GFR, Estimated: >60 mL/min (ref >60)
Glucose, Bld: 208 mg/dL — ABNORMAL HIGH (ref 70–99)
Potassium: 3.4 mmol/L — ABNORMAL LOW (ref 3.5–5.1)
Sodium: 136 mmol/L (ref 135–145)
Total Bilirubin: 0.6 mg/dL (ref 0.0–1.2)
Total Protein: 7 g/dL (ref 6.5–8.1)

## 2023-02-22 LAB — CBC WITH DIFFERENTIAL/PLATELET
Abs Immature Granulocytes: 0.01 10*3/uL (ref 0.00–0.07)
Basophils Absolute: 0 10*3/uL (ref 0.0–0.1)
Basophils Relative: 1 %
Eosinophils Absolute: 0 10*3/uL (ref 0.0–0.5)
Eosinophils Relative: 0 %
HCT: 42.8 % (ref 36.0–46.0)
Hemoglobin: 14.3 g/dL (ref 12.0–15.0)
Immature Granulocytes: 0 %
Lymphocytes Relative: 28 %
Lymphs Abs: 0.9 10*3/uL (ref 0.7–4.0)
MCH: 28.4 pg (ref 26.0–34.0)
MCHC: 33.4 g/dL (ref 30.0–36.0)
MCV: 85.1 fL (ref 80.0–100.0)
Monocytes Absolute: 0.3 10*3/uL (ref 0.1–1.0)
Monocytes Relative: 10 %
Neutro Abs: 2 10*3/uL (ref 1.7–7.7)
Neutrophils Relative %: 61 %
Platelets: 186 10*3/uL (ref 150–400)
RBC: 5.03 MIL/uL (ref 3.87–5.11)
RDW: 13.6 % (ref 11.5–15.5)
WBC: 3.2 10*3/uL — ABNORMAL LOW (ref 4.0–10.5)
nRBC: 0 % (ref 0.0–0.2)

## 2023-02-22 LAB — URINALYSIS, ROUTINE W REFLEX MICROSCOPIC
Bacteria, UA: NONE SEEN
Bilirubin Urine: NEGATIVE
Glucose, UA: NEGATIVE mg/dL
Ketones, ur: NEGATIVE mg/dL
Nitrite: NEGATIVE
Protein, ur: NEGATIVE mg/dL
Specific Gravity, Urine: 1.007 (ref 1.005–1.030)
pH: 6 (ref 5.0–8.0)

## 2023-02-22 LAB — HEMOGLOBIN A1C
Hgb A1c MFr Bld: 7.9 % — ABNORMAL HIGH (ref 4.8–5.6)
Mean Plasma Glucose: 180.03 mg/dL

## 2023-02-22 LAB — BASIC METABOLIC PANEL
Anion gap: 14 (ref 5–15)
BUN: 8 mg/dL (ref 6–20)
CO2: 21 mmol/L — ABNORMAL LOW (ref 22–32)
Calcium: 8 mg/dL — ABNORMAL LOW (ref 8.9–10.3)
Chloride: 101 mmol/L (ref 98–111)
Creatinine, Ser: 0.76 mg/dL (ref 0.44–1.00)
GFR, Estimated: >60 mL/min (ref >60)
Glucose, Bld: 425 mg/dL — ABNORMAL HIGH (ref 70–99)
Potassium: 3.9 mmol/L (ref 3.5–5.1)
Sodium: 136 mmol/L (ref 135–145)

## 2023-02-22 LAB — GLUCOSE, CAPILLARY: Glucose-Capillary: 399 mg/dL — ABNORMAL HIGH (ref 70–99)

## 2023-02-22 LAB — LIPASE, BLOOD: Lipase: 31 U/L (ref 11–51)

## 2023-02-22 LAB — RESP PANEL BY RT-PCR (RSV, FLU A&B, COVID)  RVPGX2
Influenza A by PCR: POSITIVE — AB
Influenza B by PCR: NEGATIVE
Resp Syncytial Virus by PCR: NEGATIVE
SARS Coronavirus 2 by RT PCR: NEGATIVE

## 2023-02-22 LAB — TROPONIN I (HIGH SENSITIVITY)
Troponin I (High Sensitivity): 3 ng/L (ref <18)
Troponin I (High Sensitivity): 3 ng/L (ref <18)

## 2023-02-22 LAB — BRAIN NATRIURETIC PEPTIDE: B Natriuretic Peptide: 9.3 pg/mL (ref 0.0–100.0)

## 2023-02-22 LAB — PROCALCITONIN: Procalcitonin: <0.1 ng/mL

## 2023-02-22 LAB — MAGNESIUM: Magnesium: 1.8 mg/dL (ref 1.7–2.4)

## 2023-02-22 LAB — PHOSPHORUS: Phosphorus: 1.6 mg/dL — ABNORMAL LOW (ref 2.5–4.6)

## 2023-02-22 MED ORDER — SODIUM CHLORIDE 0.9 % IV BOLUS
1000.0000 mL | Freq: Once | INTRAVENOUS | Status: AC
  Administered 2023-02-22: 1000 mL via INTRAVENOUS

## 2023-02-22 MED ORDER — ACETAMINOPHEN 650 MG RE SUPP: 650.0000 mg | Freq: Four times a day (QID) | RECTAL | Status: DC | PRN

## 2023-02-22 MED ORDER — POTASSIUM CHLORIDE CRYS ER 20 MEQ PO TBCR
40.0000 meq | EXTENDED_RELEASE_TABLET | Freq: Once | ORAL | Status: AC
  Administered 2023-02-22: 40 meq via ORAL
  Filled 2023-02-22: qty 2

## 2023-02-22 MED ORDER — SODIUM CHLORIDE 0.9 % IV SOLN
INTRAVENOUS | Status: AC | PRN
Start: 2023-02-22 — End: 2023-02-23

## 2023-02-22 MED ORDER — IPRATROPIUM-ALBUTEROL 0.5-2.5 (3) MG/3ML IN SOLN
3.0000 mL | Freq: Once | RESPIRATORY_TRACT | Status: AC
  Administered 2023-02-22: 3 mL via RESPIRATORY_TRACT
  Filled 2023-02-22: qty 3

## 2023-02-22 MED ORDER — SODIUM CHLORIDE 0.9 % IV SOLN
2.0000 g | INTRAVENOUS | Status: DC
  Administered 2023-02-23 – 2023-02-24 (×2): 2 g via INTRAVENOUS
  Filled 2023-02-22 (×2): qty 20

## 2023-02-22 MED ORDER — HYDROCODONE-ACETAMINOPHEN 5-325 MG PO TABS: 1.0000 | ORAL_TABLET | ORAL | Status: DC | PRN

## 2023-02-22 MED ORDER — METHYLPREDNISOLONE SODIUM SUCC 125 MG IJ SOLR
125.0000 mg | Freq: Once | INTRAMUSCULAR | Status: AC
  Administered 2023-02-22: 125 mg via INTRAVENOUS
  Filled 2023-02-22: qty 2

## 2023-02-22 MED ORDER — SODIUM CHLORIDE 0.9 % IV SOLN
500.0000 mg | Freq: Once | INTRAVENOUS | Status: AC
  Administered 2023-02-22: 500 mg via INTRAVENOUS
  Filled 2023-02-22: qty 5

## 2023-02-22 MED ORDER — IOHEXOL 300 MG/ML  SOLN
100.0000 mL | Freq: Once | INTRAMUSCULAR | Status: AC | PRN
  Administered 2023-02-22: 100 mL via INTRAVENOUS

## 2023-02-22 MED ORDER — INSULIN ASPART 100 UNIT/ML IJ SOLN
0.0000 [IU] | INTRAMUSCULAR | Status: DC
  Administered 2023-02-22: 9 [IU] via SUBCUTANEOUS
  Administered 2023-02-23: 7 [IU] via SUBCUTANEOUS
  Administered 2023-02-23: 3 [IU] via SUBCUTANEOUS
  Administered 2023-02-23: 7 [IU] via SUBCUTANEOUS
  Administered 2023-02-23: 9 [IU] via SUBCUTANEOUS
  Administered 2023-02-23: 5 [IU] via SUBCUTANEOUS
  Administered 2023-02-23: 7 [IU] via SUBCUTANEOUS
  Administered 2023-02-24: 9 [IU] via SUBCUTANEOUS
  Administered 2023-02-24: 2 [IU] via SUBCUTANEOUS
  Administered 2023-02-24: 5 [IU] via SUBCUTANEOUS
  Administered 2023-02-24: 3 [IU] via SUBCUTANEOUS
  Administered 2023-02-24 – 2023-02-25 (×2): 2 [IU] via SUBCUTANEOUS
  Administered 2023-02-25: 1 [IU] via SUBCUTANEOUS
  Administered 2023-02-25: 2 [IU] via SUBCUTANEOUS
  Administered 2023-02-25: 3 [IU] via SUBCUTANEOUS

## 2023-02-22 MED ORDER — IPRATROPIUM-ALBUTEROL 0.5-2.5 (3) MG/3ML IN SOLN
3.0000 mL | Freq: Four times a day (QID) | RESPIRATORY_TRACT | Status: DC
  Administered 2023-02-22 – 2023-02-23 (×3): 3 mL via RESPIRATORY_TRACT
  Filled 2023-02-22 (×3): qty 3

## 2023-02-22 MED ORDER — ACETAMINOPHEN 325 MG PO TABS: 650.0000 mg | ORAL_TABLET | Freq: Four times a day (QID) | ORAL | Status: DC | PRN

## 2023-02-22 MED ORDER — POTASSIUM PHOSPHATES 15 MMOLE/5ML IV SOLN
15.0000 mmol | Freq: Once | INTRAVENOUS | Status: AC
  Administered 2023-02-22: 15 mmol via INTRAVENOUS
  Filled 2023-02-22 (×2): qty 5

## 2023-02-22 MED ORDER — NICOTINE 14 MG/24HR TD PT24
14.0000 mg | MEDICATED_PATCH | Freq: Every day | TRANSDERMAL | Status: DC
  Administered 2023-02-22: 14 mg via TRANSDERMAL
  Filled 2023-02-22 (×3): qty 1

## 2023-02-22 MED ORDER — HYDROCOD POLI-CHLORPHE POLI ER 10-8 MG/5ML PO SUER
5.0000 mL | Freq: Two times a day (BID) | ORAL | Status: DC | PRN
  Administered 2023-02-22 – 2023-02-24 (×4): 5 mL via ORAL
  Filled 2023-02-22 (×4): qty 5

## 2023-02-22 MED ORDER — HYDROCOD POLST-CPM POLST ER 10-8 MG/5ML PO SUER: 5.0000 mL | Freq: Four times a day (QID) | ORAL | Status: DC

## 2023-02-22 MED ORDER — SODIUM CHLORIDE 0.9 % IV SOLN
1.0000 g | Freq: Once | INTRAVENOUS | Status: AC
  Administered 2023-02-22: 1 g via INTRAVENOUS
  Filled 2023-02-22: qty 10

## 2023-02-22 MED ORDER — SODIUM CHLORIDE 0.9 % IV SOLN
INTRAVENOUS | Status: AC
Start: 2023-02-22 — End: 2023-02-23

## 2023-02-22 MED ORDER — METHYLPREDNISOLONE SODIUM SUCC 40 MG IJ SOLR
40.0000 mg | Freq: Every day | INTRAMUSCULAR | Status: AC
  Administered 2023-02-23: 40 mg via INTRAVENOUS
  Filled 2023-02-22: qty 1

## 2023-02-22 MED ORDER — ATORVASTATIN CALCIUM 20 MG PO TABS
20.0000 mg | ORAL_TABLET | Freq: Every day | ORAL | Status: DC
  Administered 2023-02-23 – 2023-02-25 (×3): 20 mg via ORAL
  Filled 2023-02-22 (×3): qty 1

## 2023-02-22 MED ORDER — ONDANSETRON HCL 4 MG/2ML IJ SOLN: 4.0000 mg | Freq: Four times a day (QID) | INTRAMUSCULAR | Status: DC | PRN

## 2023-02-22 MED ORDER — PREDNISONE 20 MG PO TABS
40.0000 mg | ORAL_TABLET | Freq: Every day | ORAL | Status: DC
  Administered 2023-02-24 – 2023-02-25 (×2): 40 mg via ORAL
  Filled 2023-02-22 (×2): qty 2

## 2023-02-22 MED ORDER — ONDANSETRON HCL 4 MG PO TABS: 4.0000 mg | ORAL_TABLET | Freq: Four times a day (QID) | ORAL | Status: DC | PRN

## 2023-02-22 MED ORDER — ALBUTEROL SULFATE (2.5 MG/3ML) 0.083% IN NEBU: 2.5000 mg | INHALATION_SOLUTION | RESPIRATORY_TRACT | Status: DC | PRN

## 2023-02-22 MED ORDER — SODIUM CHLORIDE 0.9 % IV SOLN
500.0000 mg | INTRAVENOUS | Status: DC
  Administered 2023-02-23 – 2023-02-24 (×2): 500 mg via INTRAVENOUS
  Filled 2023-02-22 (×2): qty 5

## 2023-02-22 NOTE — ED Notes (Signed)
O2 via [place] increased to 4 LPM per Dr. Wallace Cullens.

## 2023-02-22 NOTE — Assessment & Plan Note (Signed)
No longer on anticoagulation

## 2023-02-22 NOTE — Assessment & Plan Note (Signed)
Suspect viral pneumonia Or now continue Rocephin, azithromycin check procalcitonin adjust antibiotics as needed

## 2023-02-22 NOTE — Assessment & Plan Note (Signed)
Allow permissive hypertension

## 2023-02-22 NOTE — ED Notes (Signed)
Patient transported to X-ray

## 2023-02-22 NOTE — ED Notes (Addendum)
RT Note: Humidifier water bottle was added to patient's oxygen. Patient had stated she was having a dry mouth and nose from the oygen. Patient currently on 4lpm [place]

## 2023-02-22 NOTE — ED Notes (Signed)
Lucretia Kern at [facility] for transport 16:56

## 2023-02-22 NOTE — Assessment & Plan Note (Signed)
Likely contributing to respiratory status. For tonight continue Rocephin/azithromycin for possible underlying pneumonia Check Pro-Cal acetone and DC if unremarkable Continue steroids as patient showed some improvement decreased wheezing.  Continue albuterol as needed and scheduled DuoNeb

## 2023-02-22 NOTE — Assessment & Plan Note (Signed)
Patient has been symptomatic for past 5 days now out of window for treatment continue to treat symptomatically

## 2023-02-22 NOTE — Assessment & Plan Note (Signed)
-  Spoke about importance of quitting spent 5 minutes discussing options for treatment, prior attempts at quitting, and dangers of smoking ? -At this point patient is    NOT  interested in quitting ? - order nicotine patch  ? - nursing tobacco cessation protocol ? ?

## 2023-02-22 NOTE — H&P (Signed)
 [name] [name] ZOX:[id number] DOB: 1970-06-17 DOA: 02/22/2023     PCP: Jarrett Soho, PA-C     Patient arrived to ER on 02/22/23 at 0624 Referred by Attending Kirby Crigler, Mir Judie Petit, MD   Patient coming from:    home Lives With family  Chief Complaint:   Chief Complaint  Patient presents with   Shortness of Breath    HPI: [name] [name] is a 53 y.o. female with medical history significant of  DM2, history of DVT, bronchitis, sleep apnea, tobacco abuse, HLD    Presented with   shortness of breath Has been short of breath for past few days had recent flu exposure In emergency department O2 sats 88-89 complaining of lower abdominal pain started on 2 L  Patient has history of diabetes type 2 and prior DVT as well as bronchitis has been having URI symptoms multiple sick contacts spouse has a flu has been having poor appetite denies any nausea vomiting cough productive of clear sputum Usually uses CPAP for sleep apnea but not on oxygen She does smoke Reports fevers and chills   Denies significant ETOH intake  Does smoke but not  interested in quitting  Lab Results  Component Value Date   SARSCOV2NAA NEGATIVE 02/22/2023   SARSCOV2NAA RESULT: POSITIVE (A) 01/20/2020     Regarding pertinent Chronic problems:  Hyperlipidemia - on statins Lipitor (atorvastatin)  Lipid Panel     Component Value Date/Time   CHOL  02/02/2010 0848    167        ATP III CLASSIFICATION:  <200     mg/dL   Desirable  811-914  mg/dL   Borderline High  >=782    mg/dL   High          TRIG 956 02/02/2010 0848   HDL 24 (L) 02/02/2010 0848   CHOLHDL 7.0 02/02/2010 0848   VLDL 25 02/02/2010 0848   LDLCALC (H) 02/02/2010 0848    118        Total Cholesterol/HDL:CHD Risk Coronary Heart Disease Risk Table                     Men   Women  1/2 Average Risk   3.4   3.3  Average Risk       5.0   4.4  2 X Average Risk   9.6   7.1  3 X Average Risk  23.4   11.0        Use the calculated Patient  Ratio above and the CHD Risk Table to determine the patient's CHD Risk.        ATP III CLASSIFICATION (LDL):  <100     mg/dL   Optimal  213-086  mg/dL   Near or Above                    Optimal  130-159  mg/dL   Borderline  578-469  mg/dL   High  >629     mg/dL   Very High    HTN on lisinopril   DM 2 -  Lab Results  Component Value Date   HGBA1C (H) 02/01/2010    6.6 (NOTE)  According to the ADA Clinical Practice Recommendations for 2011, when HbA1c is used as a screening test:   >=6.5%   Diagnostic of Diabetes Mellitus           (if abnormal result  is confirmed)  5.7-6.4%   Increased risk of developing Diabetes Mellitus  References:Diagnosis and Classification of Diabetes Mellitus,Diabetes Care,2011,34(Suppl 1):S62-S69 and Standards of Medical Care in         Diabetes - 2011,Diabetes Care,2011,34  (Suppl 1):S11-S61.  on PO meds only, ozempic   Morbid obesity-   BMI Readings from Last 1 Encounters:  02/22/23 51.15 kg/m     OSA -on nocturnal CPAP,   While in ER: Clinical Course as of 02/22/23 1909  Wed Feb 22, 2023  0731 Influenza A By PCR(!): POSITIVE [SG]  [phone] CT with possible concurrent pneumonia.  Will cover with antibiotics, recommend inpatient team follow-up procal and de-escalate as necessary [SG]  0937 Potassium(!): 3.4 replace [SG]    Clinical Course User Index [SG] Sloan Leiter, DO       Lab Orders         Resp panel by RT-PCR (RSV, Flu A&B, Covid) Anterior Nasal Swab         Expectorated Sputum Assessment w Gram Stain, Rflx to Resp Cult         CBC with Differential         Brain natriuretic peptide         Comprehensive metabolic panel         Lipase, blood         Urinalysis, Routine w reflex microscopic -Urine, Clean Catch      CXR -airway thickening  CTabd/pelvis - There are new tree-in-bud configuration ground-glass nodules in the bilateral lung bases, more involving the  right lung lower lobe. Findings are likely secondary to aspiration versus bronchopneumonia.    Following Medications were ordered in ER: Medications  0.9 %  sodium chloride infusion (0 mLs Intravenous Stopped 02/22/23 1327)  sodium chloride 0.9 % bolus 1,000 mL (0 mLs Intravenous Stopped 02/22/23 1000)  ipratropium-albuterol (DUONEB) 0.5-2.5 (3) MG/3ML nebulizer solution 3 mL (3 mLs Nebulization Given 02/22/23 0741)  iohexol (OMNIPAQUE) 300 MG/ML solution 100 mL (100 mLs Intravenous Contrast Given 02/22/23 0845)  cefTRIAXone (ROCEPHIN) 1 g in sodium chloride 0.9 % 100 mL IVPB (0 g Intravenous Stopped 02/22/23 1148)  azithromycin (ZITHROMAX) 500 mg in sodium chloride 0.9 % 250 mL IVPB (0 mg Intravenous Stopped 02/22/23 1254)  methylPREDNISolone sodium succinate (SOLU-MEDROL) 125 mg/2 mL injection 125 mg (125 mg Intravenous Given 02/22/23 1027)  potassium chloride SA (KLOR-CON M) CR tablet 40 mEq (40 mEq Oral Given 02/22/23 1032)  ipratropium-albuterol (DUONEB) 0.5-2.5 (3) MG/3ML nebulizer solution 3 mL (3 mLs Nebulization Given 02/22/23 1341)       ED Triage Vitals [02/22/23 0637]  Encounter Vitals Group     BP (!) 145/69     Systolic BP Percentile      Diastolic BP Percentile      Pulse Rate 95     Resp (!) 24     Temp 99.8 F (37.7 C)     Temp Source Oral     SpO2 (!) 88 %     Weight 298 lb (135.2 kg)     Height 5\' 4"  (1.626 m)     Head Circumference      Peak Flow      Pain Score 4     Pain Loc  Pain Education      Exclude from Growth Chart   [id number]     _________________________________________ Significant initial  Findings: Abnormal Labs Reviewed  RESP PANEL BY RT-PCR (RSV, FLU A&B, COVID)  RVPGX2 - Abnormal; Notable for the following components:      Result Value   Influenza A by PCR POSITIVE (*)    All other components within normal limits  CBC WITH DIFFERENTIAL/PLATELET - Abnormal; Notable for the following components:   WBC 3.2 (*)    All other components within  normal limits  COMPREHENSIVE METABOLIC PANEL - Abnormal; Notable for the following components:   Potassium 3.4 (*)    Glucose, Bld 208 (*)    Calcium 8.2 (*)    All other components within normal limits  URINALYSIS, ROUTINE W REFLEX MICROSCOPIC - Abnormal; Notable for the following components:   Hgb urine dipstick TRACE (*)    Leukocytes,Ua TRACE (*)    All other components within normal limits  _________________________ Troponin  Cardiac Panel (last 3 results) Recent Labs    02/22/23 0744 02/22/23 1148  TROPONINIHS 3 3   ECG: Ordered Personally reviewed and interpreted by me showing: HR : 91 Rhythm: Sinus rhythm Low voltage, extremity and precordial leads QTC 463  BNP (last 3 results) Recent Labs    02/22/23 0744  BNP 9.3     COVID-19 Labs  No results for input(s): "DDIMER", "FERRITIN", "LDH", "CRP" in the last 72 hours.  Lab Results  Component Value Date   SARSCOV2NAA NEGATIVE 02/22/2023   SARSCOV2NAA RESULT: POSITIVE (A) 01/20/2020  The recent clinical data is shown below. Vitals:   02/22/23 1530 02/22/23 1545 02/22/23 1705 02/22/23 1841  BP: 102/72 (!) 87/74 118/81 136/75  Pulse: 87 92 90 86  Resp: 20 17 20 20   Temp:   97.8 F (36.6 C) 97.9 F (36.6 C)  TempSrc:   Oral Oral  SpO2: 94% 95% 95% 90%  Weight:      Height:        WBC     Component Value Date/Time   WBC 3.2 (L) 02/22/2023 0744   LYMPHSABS 0.9 02/22/2023 0744   MONOABS 0.3 02/22/2023 0744   EOSABS 0.0 02/22/2023 0744   BASOSABS 0.0 02/22/2023 0744   Procalcitonin  Ordered      UA   no evidence of UTI      Urine analysis:    Component Value Date/Time   COLORURINE YELLOW 02/22/2023 0744   APPEARANCEUR CLEAR 02/22/2023 0744   LABSPEC 1.007 02/22/2023 0744   PHURINE 6.0 02/22/2023 0744   GLUCOSEU NEGATIVE 02/22/2023 0744   HGBUR TRACE (A) 02/22/2023 0744   BILIRUBINUR NEGATIVE 02/22/2023 0744   KETONESUR NEGATIVE 02/22/2023 0744   PROTEINUR NEGATIVE 02/22/2023 0744    UROBILINOGEN 0.2 07/03/2010 2205   NITRITE NEGATIVE 02/22/2023 0744   LEUKOCYTESUR TRACE (A) 02/22/2023 0744    Results for orders placed or performed during the hospital encounter of 02/22/23  Resp panel by RT-PCR (RSV, Flu A&B, Covid) Anterior Nasal Swab     Status: Abnormal   Collection Time: 02/22/23  6:41 AM   Specimen: Anterior Nasal Swab  Result Value Ref Range Status   SARS Coronavirus 2 by RT PCR NEGATIVE NEGATIVE Final         Influenza A by PCR POSITIVE (A) NEGATIVE Final   Influenza B by PCR NEGATIVE NEGATIVE Final         Resp Syncytial Virus by PCR NEGATIVE NEGATIVE Final  ABX started Antibiotics Given (last 72 hours)     Date/Time Action Medication Dose Rate   02/22/23 1032 New Bag/Given   cefTRIAXone (ROCEPHIN) 1 g in sodium chloride 0.9 % 100 mL IVPB 1 g 200 mL/hr   02/22/23 1154 New Bag/Given   azithromycin (ZITHROMAX) 500 mg in sodium chloride 0.9 % 250 mL IVPB 500 mg 250 mL/hr        __________________________________________________________ Recent Labs  Lab 02/22/23 0744 02/22/23 2058  NA 136 136  K 3.4* 3.9  CO2 29 21*  GLUCOSE 208* 425*  BUN 6 8  CREATININE 0.50 0.76  CALCIUM 8.2* 8.0*  MG  --  1.8  PHOS  --  1.6*    Cr  stable,   Lab Results  Component Value Date   CREATININE 0.50 02/22/2023   CREATININE 0.71 01/27/2020   CREATININE 0.69 01/26/2020    Recent Labs  Lab 02/22/23 0744  AST 39  ALT 24  ALKPHOS 51  BILITOT 0.6  PROT 7.0  ALBUMIN 3.6   Lab Results  Component Value Date   CALCIUM 8.2 (L) 02/22/2023    Plt: Lab Results  Component Value Date   PLT 186 02/22/2023       Recent Labs  Lab 02/22/23 0744  WBC 3.2*  NEUTROABS 2.0  HGB 14.3  HCT 42.8  MCV 85.1  PLT 186    HG/HCT  stable,      Component Value Date/Time   HGB 14.3 02/22/2023 0744   HCT 42.8 02/22/2023 0744   MCV 85.1 02/22/2023 0744    Recent Labs  Lab 02/22/23 0744  LIPASE 31     _______________________________________________ Hospitalist was called for admission for   Hypoxia  Influenza A    The following Work up has been ordered so far:  Orders Placed This Encounter  Procedures   Critical Care   Resp panel by RT-PCR (RSV, Flu A&B, Covid) Anterior Nasal Swab   Expectorated Sputum Assessment w Gram Stain, Rflx to Resp Cult   DG Chest 2 View   CT ABDOMEN PELVIS W CONTRAST   CBC with Differential   Brain natriuretic peptide   Comprehensive metabolic panel   Lipase, blood   Urinalysis, Routine w reflex microscopic -Urine, Clean Catch   Diet Heart Room service appropriate? Yes; Fluid consistency: Thin   ED Cardiac monitoring   If O2 Sat <94% administer O2 at 2 liters/minute via nasal cannula   Page Admitting Doctor upon patients arrival to unit/floor   Vital signs   Up with assistance   Initiate Carrier Fluid Protocol   Consult to hospitalist   ED EKG   EKG 12-Lead   EKG   EKG   Insert peripheral IV   Saline lock IV   Place in observation (patient's expected length of stay will be less than 2 midnights)     OTHER Significant initial  Findings:  labs showing:     DM  labs:  HbA1C: No results for input(s): "HGBA1C" in the last 8760 hours.     CBG (last 3)  No results for input(s): "GLUCAP" in the last 72 hours.        Cultures:    Component Value Date/Time   SDES URINE, RANDOM 07/03/2010 2206   SPECREQUEST NONE 07/03/2010 2206   CULT  07/03/2010 2206    Multiple bacterial morphotypes present, none predominant. Suggest appropriate recollection if clinically indicated.   REPTSTATUS 07/05/2010 FINAL 07/03/2010 2206     Radiological Exams on Admission: CT ABDOMEN  PELVIS W CONTRAST Result Date: 02/22/2023 CLINICAL DATA:  RLQ abdominal pain RLQ abd pain, feels "just like when i had a rectus sheath hematoma from coughing too hard" EXAM: CT ABDOMEN AND PELVIS WITH CONTRAST TECHNIQUE: Multidetector CT imaging of the abdomen and pelvis was  performed using the standard protocol following bolus administration of intravenous contrast. RADIATION DOSE REDUCTION: This exam was performed according to the departmental dose-optimization program which includes automated exposure control, adjustment of the mA and/or kV according to patient size and/or use of iterative reconstruction technique. CONTRAST:  OMNIPAQUE IOHEXOL 300 MG/ML  SOLN COMPARISON:  CT scan abdomen and pelvis from 08/13/2020. FINDINGS: Lower chest: There are new tree-in-bud configuration ground-glass nodules in the bilateral lung bases, asymmetrically more involving the right lung lower lobe. Findings are likely secondary to aspiration versus bronchopneumonia. Correlate clinically. No pleural effusion. The heart is normal in size. No pericardial effusion. Hepatobiliary: The liver is normal in size. Non-cirrhotic configuration. No suspicious mass. No intrahepatic or extrahepatic bile duct dilation. Gallbladder is surgically absent. Pancreas: Unremarkable. No pancreatic ductal dilatation or surrounding inflammatory changes. Spleen: Within normal limits. No focal lesion. Adrenals/Urinary Tract: Adrenal glands are unremarkable. No suspicious renal mass. Focal scarring noted in the left kidney lower pole, laterally. No nephroureterolithiasis or obstructive uropathy on either side. Urinary bladder is under distended, precluding optimal assessment. However, no large mass or stones identified. No perivesical fat stranding. Stomach/Bowel: There is a small diverticulum arising from the second part of duodenum. No disproportionate dilation of the small or large bowel loops. No evidence of abnormal bowel wall thickening or inflammatory changes. The appendix is unremarkable. There are scattered diverticula mainly in the sigmoid colon, without imaging signs of diverticulitis. Vascular/Lymphatic: No ascites or pneumoperitoneum. No abdominal or pelvic lymphadenopathy, by size criteria. No aneurysmal  dilation of the major abdominal arteries. Reproductive: Normal-size anteverted uterus. There is a T-shaped intrauterine device, which appears in satisfactory position. Bilateral ovaries are within normal limits. There is focal dystrophic calcification in the left ovary, which is similar to the prior study and of minimal clinical significance. Other: There is a small fat containing umbilical hernia. The soft tissues and abdominal wall are otherwise unremarkable. Musculoskeletal: No suspicious osseous lesions. There are mild - moderate multilevel degenerative changes in the visualized spine. IMPRESSION: 1. No acute inflammatory process identified within the abdomen or pelvis. 2. There are new tree-in-bud configuration ground-glass nodules in the bilateral lung bases, more involving the right lung lower lobe. Findings are likely secondary to aspiration versus bronchopneumonia. Correlate clinically. Consider short-term follow-up examination in 6-12 weeks to document resolution. 3. Multiple other nonacute observations, as described above. Electronically Signed   By: Jules Schick M.D.   On: 02/22/2023 09:14   DG Chest 2 View Result Date: 02/22/2023 CLINICAL DATA:  Shortness of breath EXAM: CHEST - 2 VIEW COMPARISON:  10/25/2017 FINDINGS: Generalized airway thickening. There is no edema, consolidation, effusion, or pneumothorax. Normal heart size and mediastinal contours. IMPRESSION: Airway thickening without collapse or consolidation. Electronically Signed   By: Tiburcio Pea M.D.   On: 02/22/2023 06:57   _______________________________________________________________________________________________________ Latest  Blood pressure 136/75, pulse 86, temperature 97.9 F (36.6 C), temperature source Oral, resp. rate 20, height 5\' 4"  (1.626 m), weight 135.2 kg, SpO2 90%.   Vitals  labs and radiology finding personally reviewed  Review of Systems:    Pertinent positives include:   Fevers, chills, fatigue,   shortness of breath at rest.   dyspnea on exertion,  Constitutional:  No  weight loss, night sweats,weight loss  HEENT:  No headaches, Difficulty swallowing,Tooth/dental problems,Sore throat,  No sneezing, itching, ear ache, nasal congestion, post nasal drip,  Cardio-vascular:  No chest pain, Orthopnea, PND, anasarca, dizziness, palpitations.no Bilateral lower extremity swelling  GI:  No heartburn, indigestion, abdominal pain, nausea, vomiting, diarrhea, change in bowel habits, loss of appetite, melena, blood in stool, hematemesis Resp:  no No excess mucus, no productive cough, No non-productive cough, No coughing up of blood.No change in color of mucus.No wheezing. Skin:  no rash or lesions. No jaundice GU:  no dysuria, change in color of urine, no urgency or frequency. No straining to urinate.  No flank pain.  Musculoskeletal:  No joint pain or no joint swelling. No decreased range of motion. No back pain.  Psych:  No change in mood or affect. No depression or anxiety. No memory loss.  Neuro: no localizing neurological complaints, no tingling, no weakness, no double vision, no gait abnormality, no slurred speech, no confusion  All systems reviewed and apart from HOPI all are negative _______________________________________________________________________________________________ Past Medical History:   Past Medical History:  Diagnosis Date   Abdominal pain    Bronchitis    Complication of anesthesia    hard to wake up   Diabetes mellitus    Nipple discharge    Pneumonia       Past Surgical History:  Procedure Laterality Date   CHOLECYSTECTOMY     COLONOSCOPY WITH PROPOFOL N/A 05/07/2014   Procedure: COLONOSCOPY WITH PROPOFOL;  Surgeon: Willis Modena, MD;  Location: [facility];  Service: Endoscopy;  Laterality: N/A;   corrective leg surgery     DILATION AND CURETTAGE OF UTERUS     GANGLION CYST EXCISION     right wrist   ganglion cyst removed     LEG SURGERY      corrective surgery to right leg x 2   TONSILECTOMY, ADENOIDECTOMY, BILATERAL MYRINGOTOMY AND TUBES     TONSILLECTOMY     age 40    Social History:  Ambulatory   independently       reports that she has been smoking cigarettes. She has never used smokeless tobacco. She reports current alcohol use of about 2.0 standard drinks of alcohol per week. She reports that she does not currently use drugs.   Family History:  Family History  Adopted: Yes   ______________________________________________________________________________________________ Allergies: Allergies  Allergen Reactions   Musk     Bronchial spasms   Other     Fresh cut grass and bleach -bronchial spasms     Prior to Admission medications   Medication Sig Start Date End Date Taking? Authorizing Provider  OZEMPIC, 1 MG/DOSE, 4 MG/3ML SOPN Inject 1 mg into the skin once a week. 02/06/23  Yes [provider]  albuterol (PROVENTIL HFA;VENTOLIN HFA) 108 (90 BASE) MCG/ACT inhaler Inhale 1 puff into the lungs every 6 (six) hours as needed for wheezing or shortness of breath.    [provider]  ALPRAZolam Prudy Feeler) 0.25 MG tablet Take 0.25 mg by mouth 2 (two) times daily as needed for anxiety. 04/02/19   [provider]  atorvastatin (LIPITOR) 20 MG tablet Take 20 mg by mouth daily. 04/02/19   [provider]  chlorpheniramine-HYDROcodone (TUSSIONEX) 10-8 MG/5ML SUER Take 5 mLs by mouth every 6 (six) hours. 01/27/20   Rhetta Mura, MD  cholecalciferol (VITAMIN D3) 25 MCG (1000 UNIT) tablet Take 2,000 Units by mouth every evening.    [provider]  cholestyramine Lanetta Inch) 4  g packet Take 4 g by mouth daily. 01/22/20   [provider]  guaiFENesin-dextromethorphan (ROBITUSSIN DM) 100-10 MG/5ML syrup Take 5 mLs by mouth every 4 (four) hours as needed for cough. 01/27/20   Rhetta Mura, MD  lisinopril (ZESTRIL) 5 MG tablet Take 5 mg by mouth daily. 12/08/19   [provider]  metFORMIN (GLUCOPHAGE-XR) 500 MG 24 hr tablet Take 500 mg by mouth 2 (two) times daily.    [provider]  omeprazole (PRILOSEC) 20 MG capsule Take 40 mg by mouth daily. 04/02/19   [provider]  oxyCODONE (OXY IR/ROXICODONE) 5 MG immediate release tablet Take 1 tablet (5 mg total) by mouth every 6 (six) hours as needed for moderate pain. 01/27/20   Rhetta Mura, MD  PARoxetine (PAXIL) 20 MG tablet Take 20 mg by mouth every morning.    [provider]  Probiotic Product (RESTORA PO) Take 1 tablet by mouth every evening.    [provider]    ___________________________________________________________________________________________________ Physical Exam:    02/22/2023    6:41 PM 02/22/2023    5:05 PM 02/22/2023    3:45 PM  Vitals with BMI  Systolic 136 118 87  Diastolic 75 81 74  Pulse 86 90 92     1. General:  in No  Acute distress   Chronically ill   -appearing 2. Psychological: Alert and   Oriented 3. Head/ENT:    Dry Mucous Membranes                          Head Non traumatic, neck supple                          Poor Dentition 4. SKIN: n decreased Skin turgor,  Skin clean Dry and intact no rash    5. Heart: Regular rate and rhythm no  Murmur, no Rub or gallop 6. Lungs:  decreased breath sounds,  wheezes no crackles   7. Abdomen: Soft,  non-tender, Non distended   obese  bowel sounds present 8. Lower extremities: no clubbing, cyanosis, no  edema 9. Neurologically Grossly intact, moving all 4 extremities equally  10. MSK: Normal range of motion    Chart has been reviewed  ______________________________________________________________________________________________  Assessment/Plan 53 y.o. female with medical history significant of  DM2, history of DVT, bronchitis, sleep apnea, tobacco abuse, HLD  Admitted for   Hypoxia  Influenza A    Present on Admission:  Acute respiratory failure with hypoxia (HCC)   Hypokalemia  Hyperlipidemia  Essential hypertension  COPD with acute exacerbation (HCC)  Tobacco abuse  CAP (community acquired pneumonia)  Influenza A with pneumonia    Acute respiratory failure with hypoxia (HCC)  this patient has acute respiratory failure with Hypoxia  as documented by the presence of following: O2 saturatio< 90% on RA  Likely due to flu a Provide O2 therapy and titrate as needed  Continuous pulse ox   check Pulse ox with ambulation prior to discharge   may need  TC consult for home O2 set up    flutter valve ordered   Diabetes mellitus type II, non insulin dependent (HCC)  - Order Sensitive SSI    -  check TSH and HgA1C  - Hold by mouth medications    History of deep vein thrombosis (DVT) of lower extremity No longer on anticoagulation  Hypokalemia Recheck and replace as needed  Hyperlipidemia Continue Lipitor 20  mg daily  Essential hypertension Allow permissive hypertension  COPD with acute exacerbation (HCC) Likely contributing to respiratory status. For tonight continue Rocephin/azithromycin for possible underlying pneumonia Check Pro-Cal acetone and DC if unremarkable Continue steroids as patient showed some improvement decreased wheezing.  Continue albuterol as needed and scheduled DuoNeb  Tobacco abuse  - Spoke about importance of quitting spent 5 minutes discussing options for treatment, prior attempts at quitting, and dangers of smoking  -At this point patient is   NOT  interested in quitting  - order nicotine patch   - nursing tobacco cessation protocol   CAP (community acquired pneumonia) Suspect viral pneumonia Or now continue Rocephin, azithromycin check procalcitonin adjust antibiotics as needed  Influenza A with pneumonia Patient has been symptomatic for past 5 days now out of window for treatment continue to treat symptomatically   Other plan as per orders.  DVT prophylaxis:  SCD    Code Status:    Code Status: Prior  FULL CODE  as per patient   I had personally discussed CODE STATUS with patient and family  ACP   none   Family Communication:   Family not at  Bedside  plan of care was discussed on the phone with Daughter   Diet  Diet Orders (From admission, onward)     Start     Ordered   02/22/23 1155  Diet Heart Room service appropriate? Yes; Fluid consistency: Thin  Diet effective now       Question Answer Comment  Room service appropriate? Yes   Fluid consistency: Thin      02/22/23 1154            Disposition Plan:    To home once workup is complete and patient is stable   Following barriers for discharge:                                                        Electrolytes corrected                                 Consult Orders  (From admission, onward)           Start     Ordered   02/22/23 0936  Consult to hospitalist  Lucretia Kern at [facility] for hosp consult 09:37  Once       Provider:  (Not yet assigned)  Question Answer Comment  Place call to: [facility]   Reason for Consult Admit      02/22/23 0935                              Transition of care consulted                    Consults called: none  Admission status:  ED Disposition     ED Disposition  Admit   Condition  --   Comment  Hospital Area: [facility] [[id number]]  Level of Care: Progressive [102]  Admit to Progressive based on following criteria: RESPIRATORY PROBLEMS hypoxemic/hypercapnic respiratory failure that is responsive to NIPPV (BiPAP) or High Flow Nasal Cannula (6-80 lpm). Frequent assessment/intervention, no > Q2 hrs < Q4 hrs, to maintain  oxygenation and pulmonary hygiene.  Interfacility transfer: Yes  May place patient in observation at [facility] or [facility] if equivalent level of care is available:: Yes  Covid Evaluation: Asymptomatic - no recent exposure (last 10 days) testing not required  Diagnosis: Acute respiratory failure with hypoxia [facility]) [[id number]]   Admitting Physician: Maryln Gottron [[id number]]  Attending Physician: Kirby Crigler, Parks Neptune [[id number]]          Obs      Level of care     progressive      Lab Results  Component Value Date   SARSCOV2NAA NEGATIVE 02/22/2023     Precautions: admitted as   Covid Negative influenza positive     Taimur Fier 02/22/2023, 10:09 PM    [facility]     after 2 AM please page floor coverage PA If 7AM-7PM, please contact the day team taking care of the patient using [email or web address]

## 2023-02-22 NOTE — ED Notes (Signed)
RT Note: Patient requested another breathing treatment

## 2023-02-22 NOTE — ED Provider Notes (Signed)
 [place] EMERGENCY DEPARTMENT AT [facility] Provider Note  CSN: [id number] Arrival date & time: 02/22/23 1191  Chief Complaint(s) Shortness of Breath  HPI [name] [name] is a 53 y.o. female with past medical history as below, significant for rectus sheath hematoma, DVT, type II DM, bronchitis who presents to the ED with complaint of cough, URI symptoms, dyspnea, abdominal pain, recent viral exposure  Feeling unwell since 2/7, multiple sick contacts with the flu.  Spouse also has the flu.  She reports difficulty breathing over the past few days, progressively worsening, poor appetite, no nausea or vomiting.  Intermittently productive cough with clear sputum.  Right lower quadrant abdominal pain that feels "just like when I had a rectus sheath hematoma."  History of OSA on CPAP, no daytime oxygen, she is a smoker but no diagnosis of COPD or emphysema.  She reports subjective fevers and chills  Past Medical History Past Medical History:  Diagnosis Date   Abdominal pain    Bronchitis    Complication of anesthesia    hard to wake up   Diabetes mellitus    Nipple discharge    Pneumonia    Patient Active Problem List   Diagnosis Date Noted   Acute respiratory failure with hypoxia (HCC) 02/22/2023   Rectus sheath hematoma 01/26/2020   Rectus sheath hematoma, initial encounter 01/25/2020   COVID-19 virus infection 01/25/2020   Anxiety disorder 01/25/2020   History of deep vein thrombosis (DVT) of lower extremity 01/25/2020   Diabetes mellitus type II, non insulin dependent (HCC) 07/23/2012   Home Medication(s) Prior to Admission medications   Medication Sig Start Date End Date Taking? Authorizing Provider  OZEMPIC, 1 MG/DOSE, 4 MG/3ML SOPN Inject 1 mg into the skin once a week. 02/06/23  Yes [provider]  albuterol (PROVENTIL HFA;VENTOLIN HFA) 108 (90 BASE) MCG/ACT inhaler Inhale 1 puff into the lungs every 6 (six) hours as needed for wheezing or shortness of  breath.    [provider]  ALPRAZolam Prudy Feeler) 0.25 MG tablet Take 0.25 mg by mouth 2 (two) times daily as needed for anxiety. 04/02/19   [provider]  atorvastatin (LIPITOR) 20 MG tablet Take 20 mg by mouth daily. 04/02/19   [provider]  chlorpheniramine-HYDROcodone (TUSSIONEX) 10-8 MG/5ML SUER Take 5 mLs by mouth every 6 (six) hours. 01/27/20   Rhetta Mura, MD  cholecalciferol (VITAMIN D3) 25 MCG (1000 UNIT) tablet Take 2,000 Units by mouth every evening.    [provider]  cholestyramine (QUESTRAN) 4 g packet Take 4 g by mouth daily. 01/22/20   [provider]  guaiFENesin-dextromethorphan (ROBITUSSIN DM) 100-10 MG/5ML syrup Take 5 mLs by mouth every 4 (four) hours as needed for cough. 01/27/20   Rhetta Mura, MD  lisinopril (ZESTRIL) 5 MG tablet Take 5 mg by mouth daily. 12/08/19   [provider]  metFORMIN (GLUCOPHAGE-XR) 500 MG 24 hr tablet Take 500 mg by mouth 2 (two) times daily.    [provider]  omeprazole (PRILOSEC) 20 MG capsule Take 40 mg by mouth daily. 04/02/19   [provider]  oxyCODONE (OXY IR/ROXICODONE) 5 MG immediate release tablet Take 1 tablet (5 mg total) by mouth every 6 (six) hours as needed for moderate pain. 01/27/20   Rhetta Mura, MD  PARoxetine (PAXIL) 20 MG tablet Take 20 mg by mouth every morning.    [provider]  Probiotic Product (RESTORA PO) Take 1 tablet by mouth every evening.    [provider]  Past Surgical History Past Surgical History:  Procedure Laterality Date   CHOLECYSTECTOMY     COLONOSCOPY WITH PROPOFOL N/A 05/07/2014   Procedure: COLONOSCOPY WITH PROPOFOL;  Surgeon: Willis Modena, MD;  Location: [facility] ENDOSCOPY;  Service: Endoscopy;  Laterality: N/A;   corrective leg surgery     DILATION AND CURETTAGE  OF UTERUS     GANGLION CYST EXCISION     right wrist   ganglion cyst removed     LEG SURGERY     corrective surgery to right leg x 2   TONSILECTOMY, ADENOIDECTOMY, BILATERAL MYRINGOTOMY AND TUBES     TONSILLECTOMY     age 75   Family History Family History  Adopted: Yes    Social History Social History   Tobacco Use   Smoking status: Every Day    Current packs/day: 0.50    Types: Cigarettes   Smokeless tobacco: Never  Substance Use Topics   Alcohol use: Yes    Alcohol/week: 2.0 standard drinks of alcohol    Types: 2 Standard drinks or equivalent per week   Drug use: Not Currently   Allergies Musk and Other  Review of Systems A thorough review of systems was obtained and all systems are negative except as noted in the HPI and PMH.   Physical Exam Vital Signs  I have reviewed the triage vital signs BP (!) 125/97   Pulse 84   Temp 99.8 F (37.7 C) (Oral)   Resp (!) 22   Ht 5\' 4"  (1.626 m)   Wt 135.2 kg   SpO2 94%   BMI 51.15 kg/m  Physical Exam Vitals and nursing note reviewed.  Constitutional:      General: She is not in acute distress.    Appearance: Normal appearance. She is obese.  HENT:     Head: Normocephalic and atraumatic.     Right Ear: External ear normal.     Left Ear: External ear normal.     Nose: Nose normal.     Mouth/Throat:     Mouth: Mucous membranes are moist.  Eyes:     General: No scleral icterus.       Right eye: No discharge.        Left eye: No discharge.  Cardiovascular:     Rate and Rhythm: Normal rate and regular rhythm.     Pulses: Normal pulses.     Heart sounds: Normal heart sounds.  Pulmonary:     Effort: Pulmonary effort is normal. Tachypnea present. No respiratory distress.     Breath sounds: No stridor. Decreased breath sounds and wheezing present.  Abdominal:     General: Abdomen is flat. There is no distension.     Palpations: Abdomen is soft.     Tenderness: There is abdominal tenderness in the right lower  quadrant.    Musculoskeletal:     Cervical back: No rigidity.     Right lower leg: No edema.     Left lower leg: No edema.  Skin:    General: Skin is warm and dry.     Capillary Refill: Capillary refill takes less than 2 seconds.  Neurological:     Mental Status: She is alert.  Psychiatric:        Mood and Affect: Mood normal.        Behavior: Behavior normal. Behavior is cooperative.     ED Results and Treatments Labs (all labs ordered are listed, but only abnormal results are displayed) Labs Reviewed  RESP PANEL BY  RT-PCR (RSV, FLU A&B, COVID)  RVPGX2 - Abnormal; Notable for the following components:      Result Value   Influenza A by PCR POSITIVE (*)    All other components within normal limits  CBC WITH DIFFERENTIAL/PLATELET - Abnormal; Notable for the following components:   WBC 3.2 (*)    All other components within normal limits  COMPREHENSIVE METABOLIC PANEL - Abnormal; Notable for the following components:   Potassium 3.4 (*)    Glucose, Bld 208 (*)    Calcium 8.2 (*)    All other components within normal limits  URINALYSIS, ROUTINE W REFLEX MICROSCOPIC - Abnormal; Notable for the following components:   Hgb urine dipstick TRACE (*)    Leukocytes,Ua TRACE (*)    All other components within normal limits  EXPECTORATED SPUTUM ASSESSMENT W GRAM STAIN, RFLX TO RESP C  BRAIN NATRIURETIC PEPTIDE  LIPASE, BLOOD  TROPONIN I (HIGH SENSITIVITY)  TROPONIN I (HIGH SENSITIVITY)                                                                                                                          Radiology CT ABDOMEN PELVIS W CONTRAST Result Date: 02/22/2023 CLINICAL DATA:  RLQ abdominal pain RLQ abd pain, feels "just like when i had a rectus sheath hematoma from coughing too hard" EXAM: CT ABDOMEN AND PELVIS WITH CONTRAST TECHNIQUE: Multidetector CT imaging of the abdomen and pelvis was performed using the standard protocol following bolus administration of intravenous  contrast. RADIATION DOSE REDUCTION: This exam was performed according to the departmental dose-optimization program which includes automated exposure control, adjustment of the mA and/or kV according to patient size and/or use of iterative reconstruction technique. CONTRAST:  OMNIPAQUE IOHEXOL 300 MG/ML  SOLN COMPARISON:  CT scan abdomen and pelvis from 08/13/2020. FINDINGS: Lower chest: There are new tree-in-bud configuration ground-glass nodules in the bilateral lung bases, asymmetrically more involving the right lung lower lobe. Findings are likely secondary to aspiration versus bronchopneumonia. Correlate clinically. No pleural effusion. The heart is normal in size. No pericardial effusion. Hepatobiliary: The liver is normal in size. Non-cirrhotic configuration. No suspicious mass. No intrahepatic or extrahepatic bile duct dilation. Gallbladder is surgically absent. Pancreas: Unremarkable. No pancreatic ductal dilatation or surrounding inflammatory changes. Spleen: Within normal limits. No focal lesion. Adrenals/Urinary Tract: Adrenal glands are unremarkable. No suspicious renal mass. Focal scarring noted in the left kidney lower pole, laterally. No nephroureterolithiasis or obstructive uropathy on either side. Urinary bladder is under distended, precluding optimal assessment. However, no large mass or stones identified. No perivesical fat stranding. Stomach/Bowel: There is a small diverticulum arising from the second part of duodenum. No disproportionate dilation of the small or large bowel loops. No evidence of abnormal bowel wall thickening or inflammatory changes. The appendix is unremarkable. There are scattered diverticula mainly in the sigmoid colon, without imaging signs of diverticulitis. Vascular/Lymphatic: No ascites or pneumoperitoneum. No abdominal or pelvic lymphadenopathy, by size criteria. No aneurysmal dilation of the major  abdominal arteries. Reproductive: Normal-size anteverted uterus.  There is a T-shaped intrauterine device, which appears in satisfactory position. Bilateral ovaries are within normal limits. There is focal dystrophic calcification in the left ovary, which is similar to the prior study and of minimal clinical significance. Other: There is a small fat containing umbilical hernia. The soft tissues and abdominal wall are otherwise unremarkable. Musculoskeletal: No suspicious osseous lesions. There are mild - moderate multilevel degenerative changes in the visualized spine. IMPRESSION: 1. No acute inflammatory process identified within the abdomen or pelvis. 2. There are new tree-in-bud configuration ground-glass nodules in the bilateral lung bases, more involving the right lung lower lobe. Findings are likely secondary to aspiration versus bronchopneumonia. Correlate clinically. Consider short-term follow-up examination in 6-12 weeks to document resolution. 3. Multiple other nonacute observations, as described above. Electronically Signed   By: Jules Schick M.D.   On: 02/22/2023 09:14   DG Chest 2 View Result Date: 02/22/2023 CLINICAL DATA:  Shortness of breath EXAM: CHEST - 2 VIEW COMPARISON:  10/25/2017 FINDINGS: Generalized airway thickening. There is no edema, consolidation, effusion, or pneumothorax. Normal heart size and mediastinal contours. IMPRESSION: Airway thickening without collapse or consolidation. Electronically Signed   By: Tiburcio Pea M.D.   On: 02/22/2023 06:57    Pertinent labs & imaging results that were available during my care of the patient were reviewed by me and considered in my medical decision making (see MDM for details).  Medications Ordered in ED Medications  azithromycin (ZITHROMAX) 500 mg in sodium chloride 0.9 % 250 mL IVPB (has no administration in time range)  0.9 %  sodium chloride infusion ( Intravenous New Bag/Given 02/22/23 1031)  sodium chloride 0.9 % bolus 1,000 mL (0 mLs Intravenous Stopped 02/22/23 1000)   ipratropium-albuterol (DUONEB) 0.5-2.5 (3) MG/3ML nebulizer solution 3 mL (3 mLs Nebulization Given 02/22/23 0741)  iohexol (OMNIPAQUE) 300 MG/ML solution 100 mL (100 mLs Intravenous Contrast Given 02/22/23 0845)  cefTRIAXone (ROCEPHIN) 1 g in sodium chloride 0.9 % 100 mL IVPB (0 g Intravenous Stopped 02/22/23 1148)  methylPREDNISolone sodium succinate (SOLU-MEDROL) 125 mg/2 mL injection 125 mg (125 mg Intravenous Given 02/22/23 1027)  potassium chloride SA (KLOR-CON M) CR tablet 40 mEq (40 mEq Oral Given 02/22/23 1032)                                                                                                                                     Procedures .Critical Care  Performed by: Sloan Leiter, DO Authorized by: Sloan Leiter, DO   Critical care provider statement:    Critical care time (minutes):  44   Critical care time was exclusive of:  Separately billable procedures and treating other patients   Critical care was necessary to treat or prevent imminent or life-threatening deterioration of the following conditions:  Respiratory failure   Critical care was time spent personally by me on the following activities:  Development  of treatment plan with patient or surrogate, discussions with consultants, evaluation of patient's response to treatment, examination of patient, ordering and review of laboratory studies, ordering and review of radiographic studies, ordering and performing treatments and interventions, pulse oximetry, re-evaluation of patient's condition and review of old charts   Care discussed with: admitting provider     (including critical care time)  Medical Decision Making / ED Course    Medical Decision Making:    Tanish Sinkler is a 53 y.o. female with past medical history as below, significant for rectus sheath hematoma, DVT, type II DM, bronchitis who presents to the ED with complaint of cough, URI symptoms, dyspnea, abdominal pain, recent viral exposure. The  complaint involves an extensive differential diagnosis and also carries with it a high risk of complications and morbidity.  Serious etiology was considered. Ddx includes but is not limited to: In my evaluation of this patient's dyspnea my DDx includes, but is not limited to, pneumonia, pulmonary embolism, pneumothorax, pulmonary edema, metabolic acidosis, asthma, COPD, cardiac cause, anemia, anxiety, etc.    Complete initial physical exam performed, notably the patient was in mild respiratory distress, pulse ox 87% on room air on arrival, tachypnea.    Reviewed and confirmed nursing documentation for past medical history, family history, social history.  Vital signs reviewed.    Clinical Course as of 02/22/23 1154  Wed Feb 22, 2023  6644 Influenza A By PCR(!): POSITIVE [SG]  [phone] CT with possible concurrent pneumonia.  Will cover with antibiotics, recommend inpatient team follow-up procal and de-escalate as necessary [SG]  0937 Potassium(!): 3.4 replace [SG]    Clinical Course User Index [SG] Sloan Leiter, DO    Brief summary: 53 year old female history above including OSA on CPAP, DM2 here with URI symptoms, dyspnea, abdominal pain. Sick contact with the flu She does not wear home oxygen pulse ox 88% on room air, improved to 94% on 3 L nasal cannula  Labs reviewed, she has the flu.  Will replace potassium.  She is leukopenic 3.2.  Continues to require supplemental oxygen, 3 L nasal cannula, no home oxygen use at baseline.  Does use CPAP at nighttime.  Has influenza, likely provoking her resp or difficulty.  CT concern for possible concurrent pneumonia.  Admission for hypoxia, influenza, possible pneumonia.  She is agreeable  Dr Kirby Crigler accepting           Additional history obtained: -Additional history obtained from family -External records from outside source obtained and reviewed including: Chart review including previous notes, labs, imaging, consultation  notes including  Primary care documentation, home medications, prior labs   Lab Tests: -I ordered, reviewed, and interpreted labs.   The pertinent results include:   Labs Reviewed  RESP PANEL BY RT-PCR (RSV, FLU A&B, COVID)  RVPGX2 - Abnormal; Notable for the following components:      Result Value   Influenza A by PCR POSITIVE (*)    All other components within normal limits  CBC WITH DIFFERENTIAL/PLATELET - Abnormal; Notable for the following components:   WBC 3.2 (*)    All other components within normal limits  COMPREHENSIVE METABOLIC PANEL - Abnormal; Notable for the following components:   Potassium 3.4 (*)    Glucose, Bld 208 (*)    Calcium 8.2 (*)    All other components within normal limits  URINALYSIS, ROUTINE W REFLEX MICROSCOPIC - Abnormal; Notable for the following components:   Hgb urine dipstick TRACE (*)    Leukocytes,Ua TRACE (*)  All other components within normal limits  EXPECTORATED SPUTUM ASSESSMENT W GRAM STAIN, RFLX TO RESP C  BRAIN NATRIURETIC PEPTIDE  LIPASE, BLOOD  TROPONIN I (HIGH SENSITIVITY)  TROPONIN I (HIGH SENSITIVITY)    Notable for FLU  EKG   EKG Interpretation Date/Time:  Wednesday February 22 2023 07:02:10 EST Ventricular Rate:  91 PR Interval:  165 QRS Duration:  105 QT Interval:  376 QTC Calculation: 463 R Axis:   64  Text Interpretation: Sinus rhythm Low voltage, extremity and precordial leads Confirmed by Tanda Rockers ([id number]) on 02/22/2023 7:36:18 AM         Imaging Studies ordered: I ordered imaging studies including CXR, CTAP I independently visualized the following imaging with scope of interpretation limited to determining acute life threatening conditions related to emergency care; findings noted above I independently visualized and interpreted imaging. I agree with the radiologist interpretation   Medicines ordered and prescription drug management: Meds ordered this encounter  Medications   sodium chloride 0.9 %  bolus 1,000 mL   ipratropium-albuterol (DUONEB) 0.5-2.5 (3) MG/3ML nebulizer solution 3 mL   iohexol (OMNIPAQUE) 300 MG/ML solution 100 mL   cefTRIAXone (ROCEPHIN) 1 g in sodium chloride 0.9 % 100 mL IVPB    Antibiotic Indication::   CAP   azithromycin (ZITHROMAX) 500 mg in sodium chloride 0.9 % 250 mL IVPB    Antibiotic Indication::   CAP   methylPREDNISolone sodium succinate (SOLU-MEDROL) 125 mg/2 mL injection 125 mg   potassium chloride SA (KLOR-CON M) CR tablet 40 mEq   0.9 %  sodium chloride infusion    Carrier Fluid Protocol    -I have reviewed the patients home medicines and have made adjustments as needed   Consultations Obtained: na   Cardiac Monitoring: The patient was maintained on a cardiac monitor.  I personally viewed and interpreted the cardiac monitored which showed an underlying rhythm of: NSR Continuous pulse oximetry interpreted by myself, 95% on 3L.    Social Determinants of Health:  Diagnosis or treatment significantly limited by social determinants of health: current smoker and obesity Counseled patient for approximately 3 minutes regarding smoking cessation. Discussed risks of smoking and how they applied and affected their visit here today. Patient not ready to quit at this time, however will follow up with their primary doctor when they are.   CPT code: 16109: intermediate counseling for smoking cessation     Reevaluation: After the interventions noted above, I reevaluated the patient and found that they have improved  Co morbidities that complicate the patient evaluation  Past Medical History:  Diagnosis Date   Abdominal pain    Bronchitis    Complication of anesthesia    hard to wake up   Diabetes mellitus    Nipple discharge    Pneumonia       Dispostion: Disposition decision including need for hospitalization was considered, and patient admitted to the hospital.    Final Clinical Impression(s) / ED Diagnoses Final diagnoses:   Hypoxia  Influenza A        Sloan Leiter, DO 02/22/23 1154

## 2023-02-22 NOTE — Assessment & Plan Note (Signed)
Recheck and replace as needed

## 2023-02-22 NOTE — Subjective & Objective (Signed)
Has been short of breath for past few days had recent flu exposure In emergency department O2 sats 88-89 complaining of lower abdominal pain started on 2 L  Patient has history of diabetes type 2 and prior DVT as well as bronchitis has been having URI symptoms multiple sick contacts spouse has a flu has been having poor appetite denies any nausea vomiting cough productive of clear sputum Usually uses CPAP for sleep apnea but not on oxygen She does smoke Reports fevers and chills

## 2023-02-22 NOTE — Assessment & Plan Note (Signed)
Continue Lipitor 20 mg daily.

## 2023-02-22 NOTE — ED Triage Notes (Signed)
Pt c/o shob "couple days ago". Recent exposure to flu/ O0 sats 88-89 in triage. Also c/o lower abd pain. Pt placed on 2L o2

## 2023-02-22 NOTE — Assessment & Plan Note (Signed)
this patient has acute respiratory failure with Hypoxia  as documented by the presence of following: O2 saturatio< 90% on RA  Likely due to flu a Provide O2 therapy and titrate as needed  Continuous pulse ox   check Pulse ox with ambulation prior to discharge   may need  TC consult for home O2 set up    flutter valve ordered

## 2023-02-22 NOTE — Assessment & Plan Note (Signed)
-  Order Sensitive  SSI     -  check TSH and HgA1C  - Hold by mouth medications*

## 2023-02-23 DIAGNOSIS — Z86718 Personal history of other venous thrombosis and embolism: Secondary | ICD-10-CM | POA: Diagnosis not present

## 2023-02-23 DIAGNOSIS — Z7985 Long-term (current) use of injectable non-insulin antidiabetic drugs: Secondary | ICD-10-CM | POA: Diagnosis not present

## 2023-02-23 DIAGNOSIS — Z7984 Long term (current) use of oral hypoglycemic drugs: Secondary | ICD-10-CM | POA: Diagnosis not present

## 2023-02-23 DIAGNOSIS — J101 Influenza due to other identified influenza virus with other respiratory manifestations: Secondary | ICD-10-CM | POA: Diagnosis present

## 2023-02-23 DIAGNOSIS — G4733 Obstructive sleep apnea (adult) (pediatric): Secondary | ICD-10-CM | POA: Diagnosis present

## 2023-02-23 DIAGNOSIS — I10 Essential (primary) hypertension: Secondary | ICD-10-CM | POA: Diagnosis present

## 2023-02-23 DIAGNOSIS — F17210 Nicotine dependence, cigarettes, uncomplicated: Secondary | ICD-10-CM | POA: Diagnosis present

## 2023-02-23 DIAGNOSIS — Z9049 Acquired absence of other specified parts of digestive tract: Secondary | ICD-10-CM | POA: Diagnosis not present

## 2023-02-23 DIAGNOSIS — Z8616 Personal history of COVID-19: Secondary | ICD-10-CM | POA: Diagnosis not present

## 2023-02-23 DIAGNOSIS — J1008 Influenza due to other identified influenza virus with other specified pneumonia: Secondary | ICD-10-CM | POA: Diagnosis present

## 2023-02-23 DIAGNOSIS — E785 Hyperlipidemia, unspecified: Secondary | ICD-10-CM | POA: Diagnosis present

## 2023-02-23 DIAGNOSIS — J9601 Acute respiratory failure with hypoxia: Secondary | ICD-10-CM | POA: Diagnosis present

## 2023-02-23 DIAGNOSIS — E1165 Type 2 diabetes mellitus with hyperglycemia: Secondary | ICD-10-CM | POA: Diagnosis present

## 2023-02-23 DIAGNOSIS — J440 Chronic obstructive pulmonary disease with acute lower respiratory infection: Secondary | ICD-10-CM | POA: Diagnosis present

## 2023-02-23 DIAGNOSIS — E876 Hypokalemia: Secondary | ICD-10-CM | POA: Diagnosis present

## 2023-02-23 DIAGNOSIS — Z6841 Body Mass Index (BMI) 40.0 and over, adult: Secondary | ICD-10-CM | POA: Diagnosis not present

## 2023-02-23 DIAGNOSIS — Z79899 Other long term (current) drug therapy: Secondary | ICD-10-CM | POA: Diagnosis not present

## 2023-02-23 DIAGNOSIS — Z91048 Other nonmedicinal substance allergy status: Secondary | ICD-10-CM | POA: Diagnosis not present

## 2023-02-23 DIAGNOSIS — J129 Viral pneumonia, unspecified: Secondary | ICD-10-CM | POA: Diagnosis present

## 2023-02-23 DIAGNOSIS — J441 Chronic obstructive pulmonary disease with (acute) exacerbation: Secondary | ICD-10-CM | POA: Diagnosis present

## 2023-02-23 DIAGNOSIS — Z1152 Encounter for screening for COVID-19: Secondary | ICD-10-CM | POA: Diagnosis not present

## 2023-02-23 LAB — COMPREHENSIVE METABOLIC PANEL
ALT: 27 U/L (ref 0–44)
AST: 42 U/L — ABNORMAL HIGH (ref 15–41)
Albumin: 3.1 g/dL — ABNORMAL LOW (ref 3.5–5.0)
Alkaline Phosphatase: 46 U/L (ref 38–126)
Anion gap: 8 (ref 5–15)
BUN: 11 mg/dL (ref 6–20)
CO2: 23 mmol/L (ref 22–32)
Calcium: 8.1 mg/dL — ABNORMAL LOW (ref 8.9–10.3)
Chloride: 103 mmol/L (ref 98–111)
Creatinine, Ser: 0.55 mg/dL (ref 0.44–1.00)
GFR, Estimated: >60 mL/min (ref >60)
Glucose, Bld: 288 mg/dL — ABNORMAL HIGH (ref 70–99)
Potassium: 4.1 mmol/L (ref 3.5–5.1)
Sodium: 134 mmol/L — ABNORMAL LOW (ref 135–145)
Total Bilirubin: 0.6 mg/dL (ref 0.0–1.2)
Total Protein: 7 g/dL (ref 6.5–8.1)

## 2023-02-23 LAB — RESPIRATORY PANEL BY PCR

## 2023-02-23 LAB — CBC
HCT: 42 % (ref 36.0–46.0)
Hemoglobin: 13.6 g/dL (ref 12.0–15.0)
MCH: 28.3 pg (ref 26.0–34.0)
MCHC: 32.4 g/dL (ref 30.0–36.0)
MCV: 87.3 fL (ref 80.0–100.0)
Platelets: 214 10*3/uL (ref 150–400)
RBC: 4.81 MIL/uL (ref 3.87–5.11)
RDW: 13.7 % (ref 11.5–15.5)
WBC: 2.6 10*3/uL — ABNORMAL LOW (ref 4.0–10.5)
nRBC: 0 % (ref 0.0–0.2)

## 2023-02-23 LAB — GLUCOSE, CAPILLARY
Glucose-Capillary: 238 mg/dL — ABNORMAL HIGH (ref 70–99)
Glucose-Capillary: 276 mg/dL — ABNORMAL HIGH (ref 70–99)
Glucose-Capillary: 309 mg/dL — ABNORMAL HIGH (ref 70–99)
Glucose-Capillary: 313 mg/dL — ABNORMAL HIGH (ref 70–99)
Glucose-Capillary: 329 mg/dL — ABNORMAL HIGH (ref 70–99)
Glucose-Capillary: 382 mg/dL — ABNORMAL HIGH (ref 70–99)
Glucose-Capillary: 403 mg/dL — ABNORMAL HIGH (ref 70–99)

## 2023-02-23 LAB — STREP PNEUMONIAE URINARY ANTIGEN: Strep Pneumo Urinary Antigen: NEGATIVE

## 2023-02-23 LAB — PHOSPHORUS: Phosphorus: 2.2 mg/dL — ABNORMAL LOW (ref 2.5–4.6)

## 2023-02-23 LAB — HIV ANTIBODY (ROUTINE TESTING W REFLEX): HIV Screen 4th Generation wRfx: NONREACTIVE

## 2023-02-23 LAB — MAGNESIUM: Magnesium: 2 mg/dL (ref 1.7–2.4)

## 2023-02-23 LAB — MRSA NEXT GEN BY PCR, NASAL: MRSA by PCR Next Gen: NOT DETECTED

## 2023-02-23 MED ORDER — K PHOS MONO-SOD PHOS DI & MONO 155-852-130 MG PO TABS
250.0000 mg | ORAL_TABLET | Freq: Every day | ORAL | Status: DC
  Administered 2023-02-23 – 2023-02-25 (×3): 250 mg via ORAL
  Filled 2023-02-23 (×3): qty 1

## 2023-02-23 MED ORDER — IPRATROPIUM-ALBUTEROL 0.5-2.5 (3) MG/3ML IN SOLN
3.0000 mL | Freq: Three times a day (TID) | RESPIRATORY_TRACT | Status: DC
  Administered 2023-02-23 – 2023-02-25 (×5): 3 mL via RESPIRATORY_TRACT
  Filled 2023-02-23 (×4): qty 3

## 2023-02-23 MED ORDER — INSULIN ASPART 100 UNIT/ML IJ SOLN
10.0000 [IU] | Freq: Once | INTRAMUSCULAR | Status: AC
  Administered 2023-02-23: 10 [IU] via SUBCUTANEOUS

## 2023-02-23 MED ORDER — GUAIFENESIN-DM 100-10 MG/5ML PO SYRP
10.0000 mL | ORAL_SOLUTION | ORAL | Status: DC | PRN
  Administered 2023-02-23 – 2023-02-25 (×6): 10 mL via ORAL
  Filled 2023-02-23 (×6): qty 10

## 2023-02-23 NOTE — Hospital Course (Addendum)
 Brief Narrative:  53 year old with history of DM2, DVT, bronchitis, sleep apnea, tobacco use, HLD, OSA on CPAP admitted for shortness of breath.  Found to have influenza causing hypoxia.  Chest x-ray showed airway thickening.  CT abdomen pelvis showed new tree-in-bud groundglass opacity in bilateral lower lung concerning for aspiration versus bronchopneumonia.  Upon admission patient was started on broad-spectrum antibiotics eventually narrowed down to azithromycin p.o..  BNP was slightly elevated therefore received a dose of IV Lasix.  Over the course of hospitalization she slowly started improving.  Today medically ready for discharge.    Assessment & Plan:  Principal Problem:   Acute respiratory failure with hypoxia (HCC) Active Problems:   Diabetes mellitus type II, non insulin dependent (HCC)   History of deep vein thrombosis (DVT) of lower extremity   Hypokalemia   Hyperlipidemia   Essential hypertension   COPD with acute exacerbation (HCC)   Tobacco abuse   CAP (community acquired pneumonia)   Influenza A with pneumonia   Acute hypoxic respiratory failure (HCC)     Acute respiratory failure with hypoxia; improved.  CAP-bacterial versus viral pneumonia Influenza A positive Acute COPD exacerbation -Symptomatically slowly improving.  Procalcitonin negative, discontinue Rocephin.  Finish total 5 days of azithromycin  EOT 2/16 - Bronchodilators, I-S/flutter valve. Pulmicort.  - Slowly tapering off steroids. - Procalcitonin negative.  BNP slightly elevated.  Received a dose of lasix.    Diabetes mellitus type II, with uncontrolled hyperglycemia not on long-term insulin: Resume home regimen upon dc.    DVT history -no longer on anticoagulation   Hypokalemia / Hypophosphatemia: As needed repletion   Hyperlipidemia Continue statin.     Essential hypertension BP stable.  Hold home lisinopril IV as needed   Tobacco abuse: Cessation advised offered nicotine patch    OSA: Cont CPAP nightly   Obesity w/ Body mass index is 51.15 kg/m. : Will benefit with PCP follow-up, weight loss  healthy lifestyle  DVT prophylaxis: SCDs Start: 02/22/23 2002    Code Status: Full Code Family Communication:  Husband on the phone during my visit.  Status is: Inpatient Remains inpatient appropriate because:DC today if amb pulse ox stable.     Subjective: Tells me she feels much better today Wishing to go home  Examination:  General exam: Appears calm and comfortable  Respiratory system: Mild b/l rhonchi but improved Cardiovascular system: S1 & S2 heard, RRR. No JVD, murmurs, rubs, gallops or clicks. No pedal edema. Gastrointestinal system: Abdomen is nondistended, soft and nontender. No organomegaly or masses felt. Normal bowel sounds heard. Central nervous system: Alert and oriented. No focal neurological deficits. Extremities: Symmetric 5 x 5 power. Skin: No rashes, lesions or ulcers Psychiatry: Judgement and insight appear normal. Mood & affect appropriate.

## 2023-02-23 NOTE — Progress Notes (Signed)
   02/23/23 1610  Spiritual Encounters  Type of Visit Initial  Care provided to: Patient  Conversation partners present during encounter Physician  Reason for visit Advance directives  OnCall Visit No   Visited patient in response to consult. Discussed Advance Directive and provided the education. Patient considering assigning daughter and her fiance as agents for HCPOA. Patient reports that she arrived yesterday and was unable to sleep last night. She was anxious to hear from the MD. (Who arrived just as the chaplain was leaving.) Chaplain will revisit patient to discuss medical update concerns.   Patient was given the AD forms to discuss with family. If she wants to pursue, she will have it completed and returned. Advised patient to alert medical team when and if she needs the chaplain to collect and process.

## 2023-02-23 NOTE — Plan of Care (Signed)
Problem: Education: Goal: Knowledge of General Education information will improve Description Including pain rating scale, medication(s)/side effects and non-pharmacologic comfort measures Outcome: Progressing

## 2023-02-23 NOTE — Progress Notes (Signed)
PROGRESS NOTE [name] [name]  UYQ:[id number] DOB: Nov 26, 1970 DOA: 02/22/2023 PCP: Jarrett Soho, PA-C  Brief Narrative/Hospital Course: 53 y.o. female with medical history significant of  DM2, history of DVT, bronchitis, sleep apnea, tobacco abuse, HLD  OSA on CPAP, who presented with  shortness of breath Has been short of breath for past few days had recent flu exposure In ED  O2 sats 88-89 complaining of lower abdominal pain started on 2 L, Vitals stable at times Bp soft although. Labs- leucopenia, phos low, CBG in 200-400s. Bnp lactate, procal normal. FLU A+ CXR -airway thickening CT Abd/pelvis:There are new tree-in-bud configuration ground-glass nodules in the bilateral lung bases, more involving the right lung lower lobe. Findings are likely secondary to aspiration versus bronchopneumonia.         Subjective: Seen this morning She feels much better today but is still having wheezing and cough No Leg edema On 4 L nasal cannula, overnight afebrile Labs overall stable with hyperglycemia Phos 2.2  Assessment and Plan: Principal Problem:   Acute respiratory failure with hypoxia (HCC) Active Problems:   Diabetes mellitus type II, non insulin dependent (HCC)   History of deep vein thrombosis (DVT) of lower extremity   Hypokalemia   Hyperlipidemia   Essential hypertension   COPD with acute exacerbation (HCC)   Tobacco abuse   CAP (community acquired pneumonia)   Influenza A with pneumonia   Acute hypoxic respiratory failure (HCC)   Acute respiratory failure with hypoxia CAP-bacterial versus viral pneumonia Influenza A positive: Symptomatically 5 days PTA, imaging finding concerning for bronchopneumonia versus aspiration in the setting of influenza   with acute COPD exacerbation and hypoxic respiratory failure. Today she feels overall much better, he still having wheezing. Continue empiric antibiotics, bronchodilators, supplemental oxygen to keep saturation above 90%. Continue  systemic steroids for wheezing  Acute exacerbation of presumed COPD She does have a longstanding smoking history and likely has undiagnosed COPD.  Will start on bronchodilators steroids as above, advised to get a PFT and pulmonary follow-up upon discharge.    Diabetes mellitus type II, with uncontrolled hyperglycemia not on long-term insulin: Hold home meds-Ozempic, continue SSI   Recent Labs  Lab 02/22/23 2058 02/22/23 2100 02/23/23 0005 02/23/23 0445 02/23/23 0824  GLUCAP  --  399* 403* 276* 238*  HGBA1C 7.9*  --   --   --   --     DVT history-no longer on anticoagulation   Hypokalemia Hypophosphatemia: Replaced.  Monitor   Hyperlipidemia Continue statin.     Essential hypertension BP stable.  Hold home lisinopril   Tobacco abuse: Cessation advised offered nicotine patch   OSA: Cont CPAP nightly  Obesity w/ Body mass index is 51.15 kg/m. : Will benefit with PCP follow-up, weight loss  healthy lifestyle   DVT prophylaxis: SCDs Start: 02/22/23 2002 Code Status:   Code Status: Full Code Family Communication: plan of care discussed with patient at bedside. Patient status is: Remains hospitalized because of severity of illness Level of care: Progressive   Dispo: The patient is from: home w/ family            Anticipated disposition: TBD Objective: Vitals last 24 hrs: Vitals:   02/22/23 2152 02/23/23 0010 02/23/23 0320 02/23/23 0843  BP:  120/72 116/67   Pulse:  84 80   Resp:  18 20   Temp:  98.2 F (36.8 C) 97.6 F (36.4 C)   TempSrc:  Oral Oral   SpO2: 92% 96% 94% 96%  Weight:  Height:       Weight change:   Physical Examination: General exam: alert awake,at baseline, older than stated age, obese. HEENT:Oral mucosa moist, Ear/Nose WNL grossly Respiratory system: Bilaterally expiratory wheezing, diminished breath sounds does have air entry bilaterally no use of accessory muscle Cardiovascular system: S1 & S2 +, No JVD. Gastrointestinal system:  Abdomen soft,NT,ND, BS+ Nervous System: Alert, awake, moving all extremities,and following commands. Extremities: LE edema neg,distal peripheral pulses palpable and warm.  Skin: No rashes,no icterus. MSK: Normal muscle bulk,tone, power   Medications reviewed:  Scheduled Meds:  atorvastatin  20 mg Oral Daily   insulin aspart  0-9 Units Subcutaneous Q4H   ipratropium-albuterol  3 mL Nebulization QID   nicotine  14 mg Transdermal Daily   [START ON 02/24/2023] predniSONE  40 mg Oral Q breakfast  Continuous Infusions:  sodium chloride 10 mL/hr at 02/22/23 2344   azithromycin     cefTRIAXone (ROCEPHIN)  IV 2 g (02/23/23 0843)    Diet Order             Diet Carb Modified Fluid consistency: Thin; Room service appropriate? Yes  Diet effective now                  Intake/Output Summary (Last 24 hours) at 02/23/2023 1010 Last data filed at 02/23/2023 0504 Gross per 24 hour  Intake 1433.51 ml  Output 400 ml  Net 1033.51 ml   Net IO Since Admission: 2,033.51 mL [02/23/23 1010]  Wt Readings from Last 3 Encounters:  02/22/23 135.2 kg  01/25/20 132.9 kg  04/09/19 (!) 139 kg   Unresulted Labs (From admission, onward)     Start     Ordered   02/22/23 2003  Expectorated Sputum Assessment w Gram Stain, Rflx to Resp Cult  (COPD / Pneumonia / Cellulitis / Lower Extremity Wound)  Once,   R        02/22/23 2004   02/22/23 2003  Legionella Pneumophila Serogp 1 Ur Ag  (COPD / Pneumonia / Cellulitis / Lower Extremity Wound)  Once,   R        02/22/23 2004   02/22/23 0939  Expectorated Sputum Assessment w Gram Stain, Rflx to Resp Cult  Once,   R        02/22/23 9811          Data Reviewed: I have personally reviewed following labs and imaging studies CBC: Recent Labs  Lab 02/22/23 0744 02/23/23 0416  WBC 3.2* 2.6*  NEUTROABS 2.0  --   HGB 14.3 13.6  HCT 42.8 42.0  MCV 85.1 87.3  PLT 186 214   Basic Metabolic Panel:  Recent Labs  Lab 02/22/23 0744 02/22/23 2058 02/23/23 0416   NA 136 136 134*  K 3.4* 3.9 4.1  CL 99 101 103  CO2 29 21* 23  GLUCOSE 208* 425* 288*  BUN 6 8 11   CREATININE 0.50 0.76 0.55  CALCIUM 8.2* 8.0* 8.1*  MG  --  1.8 2.0  PHOS  --  1.6* 2.2*   GFR: Estimated Creatinine Clearance: 109 mL/min (by C-G formula based on SCr of 0.55 mg/dL). Liver Function Tests:  Recent Labs  Lab 02/22/23 0744 02/23/23 0416  AST 39 42*  ALT 24 27  ALKPHOS 51 46  BILITOT 0.6 0.6  PROT 7.0 7.0  ALBUMIN 3.6 3.1*   Recent Labs  Lab 02/22/23 0744  LIPASE 31  No results for input(s): "AMMONIA" in the last 168 hours. Recent Labs  02/22/23 2058  HGBA1C 7.9*   Recent Labs  Lab 02/22/23 2100 02/23/23 0005 02/23/23 0445 02/23/23 0824  GLUCAP 399* 403* 276* 238*   Recent Labs  Lab 02/22/23 2058  PROCALCITON <0.10   Recent Results (from the past 240 hours)  Resp panel by RT-PCR (RSV, Flu A&B, Covid) Anterior Nasal Swab     Status: Abnormal   Collection Time: 02/22/23  6:41 AM   Specimen: Anterior Nasal Swab  Result Value Ref Range Status   SARS Coronavirus 2 by RT PCR NEGATIVE NEGATIVE Final    Comment: (NOTE) SARS-CoV-2 target nucleic acids are NOT DETECTED.  The SARS-CoV-2 RNA is generally detectable in upper respiratory specimens during the acute phase of infection. The lowest concentration of SARS-CoV-2 viral copies this assay can detect is 138 copies/mL. A negative result does not preclude SARS-Cov-2 infection and should not be used as the sole basis for treatment or other patient management decisions. A negative result may occur with  improper specimen collection/handling, submission of specimen other than nasopharyngeal swab, presence of viral mutation(s) within the areas targeted by this assay, and inadequate number of viral copies(<138 copies/mL). A negative result must be combined with clinical observations, patient history, and epidemiological information. The expected result is Negative.  Fact Sheet for Patients:   BloggerCourse.com  Fact Sheet for Healthcare Providers:  SeriousBroker.it  This test is no t yet approved or cleared by the [place] FDA and  has been authorized for detection and/or diagnosis of SARS-CoV-2 by FDA under an Emergency Use Authorization (EUA). This EUA will remain  in effect (meaning this test can be used) for the duration of the COVID-19 declaration under Section 564(b)(1) of the Act, 21 U.S.C.section 360bbb-3(b)(1), unless the authorization is terminated  or revoked sooner.       Influenza A by PCR POSITIVE (A) NEGATIVE Final   Influenza B by PCR NEGATIVE NEGATIVE Final    Comment: (NOTE) The Xpert Xpress SARS-CoV-2/FLU/RSV plus assay is intended as an aid in the diagnosis of influenza from Nasopharyngeal swab specimens and should not be used as a sole basis for treatment. Nasal washings and aspirates are unacceptable for Xpert Xpress SARS-CoV-2/FLU/RSV testing.  Fact Sheet for Patients: BloggerCourse.com  Fact Sheet for Healthcare Providers: SeriousBroker.it  This test is not yet approved or cleared by the [place] FDA and has been authorized for detection and/or diagnosis of SARS-CoV-2 by FDA under an Emergency Use Authorization (EUA). This EUA will remain in effect (meaning this test can be used) for the duration of the COVID-19 declaration under Section 564(b)(1) of the Act, 21 U.S.C. section 360bbb-3(b)(1), unless the authorization is terminated or revoked.     Resp Syncytial Virus by PCR NEGATIVE NEGATIVE Final    Comment: (NOTE) Fact Sheet for Patients: BloggerCourse.com  Fact Sheet for Healthcare Providers: SeriousBroker.it  This test is not yet approved or cleared by the [place] FDA and has been authorized for detection and/or diagnosis of SARS-CoV-2 by FDA under an Emergency Use  Authorization (EUA). This EUA will remain in effect (meaning this test can be used) for the duration of the COVID-19 declaration under Section 564(b)(1) of the Act, 21 U.S.C. section 360bbb-3(b)(1), unless the authorization is terminated or revoked.  Performed at [facility], [address] [place], [place] [address]   MRSA Next Gen by PCR, Nasal     Status: None   Collection Time: 02/22/23 10:27 PM   Specimen: Nasal Mucosa; Nasal Swab  Result Value Ref Range Status  MRSA by PCR Next Gen NOT DETECTED NOT DETECTED Final    Comment: (NOTE) The GeneXpert MRSA Assay (FDA approved for NASAL specimens only), is one component of a comprehensive MRSA colonization surveillance program. It is not intended to diagnose MRSA infection nor to guide or monitor treatment for MRSA infections. Test performance is not FDA approved in patients less than 75 years old. Performed at [facility], [address], [place], [place] [address]   Antimicrobials/Microbiology: Anti-infectives (From admission, onward)    Start     Dose/Rate Route Frequency Ordered Stop   02/23/23 1200  azithromycin (ZITHROMAX) 500 mg in sodium chloride 0.9 % 250 mL IVPB        500 mg 250 mL/hr over 60 Minutes Intravenous Every 24 hours 02/22/23 1955     02/23/23 1000  cefTRIAXone (ROCEPHIN) 2 g in sodium chloride 0.9 % 100 mL IVPB        2 g 200 mL/hr over 30 Minutes Intravenous Every 24 hours 02/22/23 1955     02/22/23 0945  cefTRIAXone (ROCEPHIN) 1 g in sodium chloride 0.9 % 100 mL IVPB        1 g 200 mL/hr over 30 Minutes Intravenous  Once 02/22/23 0935 02/22/23 1148   02/22/23 0945  azithromycin (ZITHROMAX) 500 mg in sodium chloride 0.9 % 250 mL IVPB        500 mg 250 mL/hr over 60 Minutes Intravenous  Once 02/22/23 0935 02/22/23 1254         Component Value Date/Time   SDES URINE, RANDOM 07/03/2010 2206   SPECREQUEST NONE 07/03/2010 2206   CULT  07/03/2010 2206     Multiple bacterial morphotypes present, none predominant. Suggest appropriate recollection if clinically indicated.   REPTSTATUS 07/05/2010 FINAL 07/03/2010 2206     Radiology Studies: CT ABDOMEN PELVIS W CONTRAST Result Date: 02/22/2023 CLINICAL DATA:  RLQ abdominal pain RLQ abd pain, feels "just like when i had a rectus sheath hematoma from coughing too hard" EXAM: CT ABDOMEN AND PELVIS WITH CONTRAST TECHNIQUE: Multidetector CT imaging of the abdomen and pelvis was performed using the standard protocol following bolus administration of intravenous contrast. RADIATION DOSE REDUCTION: This exam was performed according to the departmental dose-optimization program which includes automated exposure control, adjustment of the mA and/or kV according to patient size and/or use of iterative reconstruction technique. CONTRAST:  OMNIPAQUE IOHEXOL 300 MG/ML  SOLN COMPARISON:  CT scan abdomen and pelvis from 08/13/2020. FINDINGS: Lower chest: There are new tree-in-bud configuration ground-glass nodules in the bilateral lung bases, asymmetrically more involving the right lung lower lobe. Findings are likely secondary to aspiration versus bronchopneumonia. Correlate clinically. No pleural effusion. The heart is normal in size. No pericardial effusion. Hepatobiliary: The liver is normal in size. Non-cirrhotic configuration. No suspicious mass. No intrahepatic or extrahepatic bile duct dilation. Gallbladder is surgically absent. Pancreas: Unremarkable. No pancreatic ductal dilatation or surrounding inflammatory changes. Spleen: Within normal limits. No focal lesion. Adrenals/Urinary Tract: Adrenal glands are unremarkable. No suspicious renal mass. Focal scarring noted in the left kidney lower pole, laterally. No nephroureterolithiasis or obstructive uropathy on either side. Urinary bladder is under distended, precluding optimal assessment. However, no large mass or stones identified. No perivesical fat stranding.  Stomach/Bowel: There is a small diverticulum arising from the second part of duodenum. No disproportionate dilation of the small or large bowel loops. No evidence of abnormal bowel wall thickening or inflammatory changes. The appendix is unremarkable. There are scattered diverticula mainly in the sigmoid  colon, without imaging signs of diverticulitis. Vascular/Lymphatic: No ascites or pneumoperitoneum. No abdominal or pelvic lymphadenopathy, by size criteria. No aneurysmal dilation of the major abdominal arteries. Reproductive: Normal-size anteverted uterus. There is a T-shaped intrauterine device, which appears in satisfactory position. Bilateral ovaries are within normal limits. There is focal dystrophic calcification in the left ovary, which is similar to the prior study and of minimal clinical significance. Other: There is a small fat containing umbilical hernia. The soft tissues and abdominal wall are otherwise unremarkable. Musculoskeletal: No suspicious osseous lesions. There are mild - moderate multilevel degenerative changes in the visualized spine. IMPRESSION: 1. No acute inflammatory process identified within the abdomen or pelvis. 2. There are new tree-in-bud configuration ground-glass nodules in the bilateral lung bases, more involving the right lung lower lobe. Findings are likely secondary to aspiration versus bronchopneumonia. Correlate clinically. Consider short-term follow-up examination in 6-12 weeks to document resolution. 3. Multiple other nonacute observations, as described above. Electronically Signed   By: Jules Schick M.D.   On: 02/22/2023 09:14   DG Chest 2 View Result Date: 02/22/2023 CLINICAL DATA:  Shortness of breath EXAM: CHEST - 2 VIEW COMPARISON:  10/25/2017 FINDINGS: Generalized airway thickening. There is no edema, consolidation, effusion, or pneumothorax. Normal heart size and mediastinal contours. IMPRESSION: Airway thickening without collapse or consolidation.  Electronically Signed   By: Tiburcio Pea M.D.   On: 02/22/2023 06:57     LOS: 0 days   Total time spent in review of labs and imaging, patient evaluation, formulation of plan, documentation and communication with family: 50 minutes  Lanae Boast, MD  [facility]  02/23/2023, 10:10 AM

## 2023-02-23 NOTE — Inpatient Diabetes Management (Signed)
  Inpatient Diabetes Program Recommendations  AACE/ADA: New Consensus Statement on Inpatient Glycemic Control   Target Ranges:  Prepandial:   less than 140 mg/dL      Peak postprandial:   less than 180 mg/dL (1-2 hours)      Critically ill patients:  140 - 180 mg/dL    Latest Reference Range & Units 02/22/23 21:00 02/23/23 00:05 02/23/23 04:45 02/23/23 08:24  Glucose-Capillary 70 - 99 mg/dL 962 (H) 952 (H) 841 (H) 238 (H)    Latest Reference Range & Units 02/22/23 07:44 02/22/23 20:58 02/23/23 04:16  Glucose 70 - 99 mg/dL 324 (H) 401 (H) 027 (H)   Review of Glycemic Control  Diabetes history: DM2 Outpatient Diabetes medications: Metformin XR 500 mg BID, Ozempic 1 mg Qweek (Sunday) Current orders for Inpatient glycemic control: Novolog 0-9 units Q4H; Prednisone 40 mg QAM  Inpatient Diabetes Program Recommendations:    Insulin: Noted patient received Solumedrol 125 mg on 02/22/23 and then 40 mg on 02/23/23; steroids have decreased to Prednisone. May want to consider ordering one time Semglee 10 units x1 now.  Thanks Orlando Penner, RN, MSN, CDCES Diabetes Coordinator Inpatient Diabetes Program [phone] (Team Pager from 8am to 5pm)

## 2023-02-24 DIAGNOSIS — J9601 Acute respiratory failure with hypoxia: Secondary | ICD-10-CM | POA: Diagnosis not present

## 2023-02-24 LAB — GLUCOSE, CAPILLARY
Glucose-Capillary: 215 mg/dL — ABNORMAL HIGH (ref 70–99)
Glucose-Capillary: 189 mg/dL — ABNORMAL HIGH (ref 70–99)
Glucose-Capillary: 192 mg/dL — ABNORMAL HIGH (ref 70–99)
Glucose-Capillary: 427 mg/dL — ABNORMAL HIGH (ref 70–99)
Glucose-Capillary: 284 mg/dL — ABNORMAL HIGH (ref 70–99)
Glucose-Capillary: 263 mg/dL — ABNORMAL HIGH (ref 70–99)

## 2023-02-24 LAB — BASIC METABOLIC PANEL
Anion gap: 8 (ref 5–15)
BUN: 15 mg/dL (ref 6–20)
CO2: 25 mmol/L (ref 22–32)
Calcium: 8.1 mg/dL — ABNORMAL LOW (ref 8.9–10.3)
Chloride: 106 mmol/L (ref 98–111)
Creatinine, Ser: 0.54 mg/dL (ref 0.44–1.00)
GFR, Estimated: >60 mL/min (ref >60)
Glucose, Bld: 212 mg/dL — ABNORMAL HIGH (ref 70–99)
Potassium: 4 mmol/L (ref 3.5–5.1)
Sodium: 139 mmol/L (ref 135–145)

## 2023-02-24 LAB — CBC
HCT: 40.3 % (ref 36.0–46.0)
Hemoglobin: 12.7 g/dL (ref 12.0–15.0)
MCH: 28.3 pg (ref 26.0–34.0)
MCHC: 31.5 g/dL (ref 30.0–36.0)
MCV: 89.8 fL (ref 80.0–100.0)
Platelets: 206 10*3/uL (ref 150–400)
RBC: 4.49 MIL/uL (ref 3.87–5.11)
RDW: 13.7 % (ref 11.5–15.5)
WBC: 5.8 10*3/uL (ref 4.0–10.5)
nRBC: 0 % (ref 0.0–0.2)

## 2023-02-24 LAB — PROCALCITONIN: Procalcitonin: <0.1 ng/mL

## 2023-02-24 LAB — BRAIN NATRIURETIC PEPTIDE: B Natriuretic Peptide: 209.2 pg/mL — ABNORMAL HIGH (ref 0.0–100.0)

## 2023-02-24 MED ORDER — HYDRALAZINE HCL 20 MG/ML IJ SOLN: 10.0000 mg | INTRAMUSCULAR | Status: DC | PRN

## 2023-02-24 MED ORDER — FUROSEMIDE 10 MG/ML IJ SOLN
40.0000 mg | Freq: Once | INTRAMUSCULAR | Status: AC
  Administered 2023-02-24: 40 mg via INTRAVENOUS
  Filled 2023-02-24: qty 4

## 2023-02-24 MED ORDER — INSULIN GLARGINE-YFGN 100 UNIT/ML ~~LOC~~ SOLN
10.0000 [IU] | Freq: Once | SUBCUTANEOUS | Status: AC
  Administered 2023-02-24: 10 [IU] via SUBCUTANEOUS
  Filled 2023-02-24: qty 0.1

## 2023-02-24 MED ORDER — AZITHROMYCIN 250 MG PO TABS
500.0000 mg | ORAL_TABLET | Freq: Every day | ORAL | Status: DC
  Administered 2023-02-25: 500 mg via ORAL
  Filled 2023-02-24: qty 2

## 2023-02-24 MED ORDER — GLUCAGON HCL RDNA (DIAGNOSTIC) 1 MG IJ SOLR
1.0000 mg | INTRAMUSCULAR | Status: DC | PRN
Start: 2023-02-24 — End: 2023-02-25

## 2023-02-24 MED ORDER — BUDESONIDE 0.5 MG/2ML IN SUSP
0.5000 mg | Freq: Two times a day (BID) | RESPIRATORY_TRACT | Status: DC
  Administered 2023-02-24 – 2023-02-25 (×3): 0.5 mg via RESPIRATORY_TRACT
  Filled 2023-02-24 (×3): qty 2

## 2023-02-24 MED ORDER — TRAZODONE HCL 50 MG PO TABS
50.0000 mg | ORAL_TABLET | Freq: Every evening | ORAL | Status: DC | PRN
  Filled 2023-02-24: qty 1

## 2023-02-24 NOTE — Progress Notes (Signed)
PROGRESS NOTE    [name] [name]  ZOX:[id number] DOB: [birth year] DOA: 02/22/2023 PCP: Jarrett Soho, PA-C    Brief Narrative:  53 year old with history of DM2, DVT, bronchitis, sleep apnea, tobacco use, HLD, OSA on CPAP admitted for shortness of breath.  Found to have influenza causing hypoxia.  Chest x-ray showed airway thickening.  CT abdomen pelvis showed new tree-in-bud groundglass opacity in bilateral lower lung concerning for aspiration versus bronchopneumonia.   Assessment & Plan:  Principal Problem:   Acute respiratory failure with hypoxia (HCC) Active Problems:   Diabetes mellitus type II, non insulin dependent (HCC)   History of deep vein thrombosis (DVT) of lower extremity   Hypokalemia   Hyperlipidemia   Essential hypertension   COPD with acute exacerbation (HCC)   Tobacco abuse   CAP (community acquired pneumonia)   Influenza A with pneumonia   Acute hypoxic respiratory failure (HCC)     Acute respiratory failure with hypoxia CAP-bacterial versus viral pneumonia Influenza A positive Acute COPD exacerbation -Symptomatically slowly improving.  Procalcitonin negative, discontinue Rocephin.  Finish total 5 days of azithromycin  EOT 2/16 - Bronchodilators, I-S/flutter valve. Add Pulmicort.Out of bed to chair.  - Slowly tapering off steroids. - Procalcitonin negative.  BNP slightly elevated.  Will give 1 dose of IV Lasix   Diabetes mellitus type II, with uncontrolled hyperglycemia not on long-term insulin: Hold home meds-Ozempic, continue SSI Hyperglycemia slowly improving.  Will give 1 dose of Semglee 10 units today   DVT history -no longer on anticoagulation   Hypokalemia / Hypophosphatemia: As needed repletion   Hyperlipidemia Continue statin.     Essential hypertension BP stable.  Hold home lisinopril IV as needed   Tobacco abuse: Cessation advised offered nicotine patch   OSA: Cont CPAP nightly   Obesity w/ Body mass index is 51.15 kg/m. :  Will benefit with PCP follow-up, weight loss  healthy lifestyle  DVT prophylaxis: SCDs Start: 02/22/23 2002    Code Status: Full Code Family Communication:   Status is: Inpatient Remains inpatient appropriate because: Ongoing management for influenza causing respiratory distress    Subjective:  Seen at bedside.  Tells me her breathing is slowly improving.  Examination:  General exam: Appears calm and comfortable  Respiratory system: Clear to auscultation. Respiratory effort normal. Cardiovascular system: S1 & S2 heard, RRR. No JVD, murmurs, rubs, gallops or clicks. No pedal edema. Gastrointestinal system: Abdomen is nondistended, soft and nontender. No organomegaly or masses felt. Normal bowel sounds heard. Central nervous system: Alert and oriented. No focal neurological deficits. Extremities: Symmetric 5 x 5 power. Skin: No rashes, lesions or ulcers Psychiatry: Judgement and insight appear normal. Mood & affect appropriate.                Diet Orders (From admission, onward)     Start     Ordered   02/22/23 2004  Diet Carb Modified Fluid consistency: Thin; Room service appropriate? Yes  Diet effective now       Question Answer Comment  Diet-HS Snack? Nothing   Calorie Level Medium 1600-2000   Fluid consistency: Thin   Room service appropriate? Yes      02/22/23 2004            Objective: Vitals:   02/24/23 0600 02/24/23 0933 02/24/23 0935 02/24/23 1209  BP:    125/89  Pulse:    74  Resp: 17   18  Temp:    (!) 97.5 F (36.4 C)  TempSrc:  Oral  SpO2:  92% 94% 92%  Weight:      Height:        Intake/Output Summary (Last 24 hours) at 02/24/2023 1300 Last data filed at 02/23/2023 2355 Gross per 24 hour  Intake 670 ml  Output --  Net 670 ml   Filed Weights   02/22/23 1610  Weight: 135.2 kg    Scheduled Meds:  atorvastatin  20 mg Oral Daily   [START ON 02/25/2023] azithromycin  500 mg Oral Daily   budesonide  0.5 mg Nebulization BID    insulin aspart  0-9 Units Subcutaneous Q4H   ipratropium-albuterol  3 mL Nebulization TID   nicotine  14 mg Transdermal Daily   phosphorus  250 mg Oral Daily   predniSONE  40 mg Oral Q breakfast   Continuous Infusions:  Nutritional status     Body mass index is 51.15 kg/m.  Data Reviewed:   CBC: Recent Labs  Lab 02/22/23 0744 02/23/23 0416 02/24/23 0359  WBC 3.2* 2.6* 5.8  NEUTROABS 2.0  --   --   HGB 14.3 13.6 12.7  HCT 42.8 42.0 40.3  MCV 85.1 87.3 89.8  PLT 186 214 206   Basic Metabolic Panel: Recent Labs  Lab 02/22/23 0744 02/22/23 2058 02/23/23 0416 02/24/23 0359  NA 136 136 134* 139  K 3.4* 3.9 4.1 4.0  CL 99 101 103 106  CO2 29 21* 23 25  GLUCOSE 208* 425* 288* 212*  BUN 6 8 11 15   CREATININE 0.50 0.76 0.55 0.54  CALCIUM 8.2* 8.0* 8.1* 8.1*  MG  --  1.8 2.0  --   PHOS  --  1.6* 2.2*  --    GFR: Estimated Creatinine Clearance: 109 mL/min (by C-G formula based on SCr of 0.54 mg/dL). Liver Function Tests: Recent Labs  Lab 02/22/23 0744 02/23/23 0416  AST 39 42*  ALT 24 27  ALKPHOS 51 46  BILITOT 0.6 0.6  PROT 7.0 7.0  ALBUMIN 3.6 3.1*   Recent Labs  Lab 02/22/23 0744  LIPASE 31   No results for input(s): "AMMONIA" in the last 168 hours. Coagulation Profile: No results for input(s): "INR", "PROTIME" in the last 168 hours. Cardiac Enzymes: No results for input(s): "CKTOTAL", "CKMB", "CKMBINDEX", "TROPONINI" in the last 168 hours. BNP (last 3 results) No results for input(s): "PROBNP" in the last 8760 hours. HbA1C: Recent Labs    02/22/23 2058  HGBA1C 7.9*   CBG: Recent Labs  Lab 02/23/23 1948 02/23/23 2344 02/24/23 0420 02/24/23 0814 02/24/23 1115  GLUCAP 329* 313* 215* 189* 192*   Lipid Profile: No results for input(s): "CHOL", "HDL", "LDLCALC", "TRIG", "CHOLHDL", "LDLDIRECT" in the last 72 hours. Thyroid Function Tests: No results for input(s): "TSH", "T4TOTAL", "FREET4", "T3FREE", "THYROIDAB" in the last 72  hours. Anemia Panel: No results for input(s): "VITAMINB12", "FOLATE", "FERRITIN", "TIBC", "IRON", "RETICCTPCT" in the last 72 hours. Sepsis Labs: Recent Labs  Lab 02/22/23 2058 02/24/23 0359  PROCALCITON <0.10 <0.10    Recent Results (from the past 240 hours)  Resp panel by RT-PCR (RSV, Flu A&B, Covid) Anterior Nasal Swab     Status: Abnormal   Collection Time: 02/22/23  6:41 AM   Specimen: Anterior Nasal Swab  Result Value Ref Range Status   SARS Coronavirus 2 by RT PCR NEGATIVE NEGATIVE Final    Comment: (NOTE) SARS-CoV-2 target nucleic acids are NOT DETECTED.  The SARS-CoV-2 RNA is generally detectable in upper respiratory specimens during the acute phase of infection. The lowest concentration  of SARS-CoV-2 viral copies this assay can detect is 138 copies/mL. A negative result does not preclude SARS-Cov-2 infection and should not be used as the sole basis for treatment or other patient management decisions. A negative result may occur with  improper specimen collection/handling, submission of specimen other than nasopharyngeal swab, presence of viral mutation(s) within the areas targeted by this assay, and inadequate number of viral copies(<138 copies/mL). A negative result must be combined with clinical observations, patient history, and epidemiological information. The expected result is Negative.  Fact Sheet for Patients:  BloggerCourse.com  Fact Sheet for Healthcare Providers:  SeriousBroker.it  This test is no t yet approved or cleared by the [place] FDA and  has been authorized for detection and/or diagnosis of SARS-CoV-2 by FDA under an Emergency Use Authorization (EUA). This EUA will remain  in effect (meaning this test can be used) for the duration of the COVID-19 declaration under Section 564(b)(1) of the Act, 21 U.S.C.section 360bbb-3(b)(1), unless the authorization is terminated  or revoked sooner.        Influenza A by PCR POSITIVE (A) NEGATIVE Final   Influenza B by PCR NEGATIVE NEGATIVE Final    Comment: (NOTE) The Xpert Xpress SARS-CoV-2/FLU/RSV plus assay is intended as an aid in the diagnosis of influenza from Nasopharyngeal swab specimens and should not be used as a sole basis for treatment. Nasal washings and aspirates are unacceptable for Xpert Xpress SARS-CoV-2/FLU/RSV testing.  Fact Sheet for Patients: BloggerCourse.com  Fact Sheet for Healthcare Providers: SeriousBroker.it  This test is not yet approved or cleared by the [place] FDA and has been authorized for detection and/or diagnosis of SARS-CoV-2 by FDA under an Emergency Use Authorization (EUA). This EUA will remain in effect (meaning this test can be used) for the duration of the COVID-19 declaration under Section 564(b)(1) of the Act, 21 U.S.C. section 360bbb-3(b)(1), unless the authorization is terminated or revoked.     Resp Syncytial Virus by PCR NEGATIVE NEGATIVE Final    Comment: (NOTE) Fact Sheet for Patients: BloggerCourse.com  Fact Sheet for Healthcare Providers: SeriousBroker.it  This test is not yet approved or cleared by the [place] FDA and has been authorized for detection and/or diagnosis of SARS-CoV-2 by FDA under an Emergency Use Authorization (EUA). This EUA will remain in effect (meaning this test can be used) for the duration of the COVID-19 declaration under Section 564(b)(1) of the Act, 21 U.S.C. section 360bbb-3(b)(1), unless the authorization is terminated or revoked.  Performed at [facility], [address], [place], [place] [address]   MRSA Next Gen by PCR, Nasal     Status: None   Collection Time: 02/22/23 10:27 PM   Specimen: Nasal Mucosa; Nasal Swab  Result Value Ref Range Status   MRSA by PCR Next Gen NOT DETECTED NOT DETECTED Final     Comment: (NOTE) The GeneXpert MRSA Assay (FDA approved for NASAL specimens only), is one component of a comprehensive MRSA colonization surveillance program. It is not intended to diagnose MRSA infection nor to guide or monitor treatment for MRSA infections. Test performance is not FDA approved in patients less than 43 years old. Performed at [facility], [address] [place], [place] [address]   Respiratory (~20 pathogens) panel by PCR     Status: Abnormal   Collection Time: 02/23/23  3:51 PM   Specimen: Nasopharyngeal Swab; Respiratory  Result Value Ref Range Status   Adenovirus NOT DETECTED NOT DETECTED Final   Coronavirus 229E NOT  DETECTED NOT DETECTED Final    Comment: (NOTE) The Coronavirus on the Respiratory Panel, DOES NOT test for the novel  Coronavirus (2019 nCoV)    Coronavirus HKU1 NOT DETECTED NOT DETECTED Final   Coronavirus NL63 NOT DETECTED NOT DETECTED Final   Coronavirus OC43 NOT DETECTED NOT DETECTED Final   Metapneumovirus NOT DETECTED NOT DETECTED Final   Rhinovirus / Enterovirus NOT DETECTED NOT DETECTED Final   Influenza A H3 DETECTED (A) NOT DETECTED Final   Influenza B NOT DETECTED NOT DETECTED Final   Parainfluenza Virus 1 NOT DETECTED NOT DETECTED Final   Parainfluenza Virus 2 NOT DETECTED NOT DETECTED Final   Parainfluenza Virus 3 NOT DETECTED NOT DETECTED Final   Parainfluenza Virus 4 NOT DETECTED NOT DETECTED Final   Respiratory Syncytial Virus NOT DETECTED NOT DETECTED Final   Bordetella pertussis NOT DETECTED NOT DETECTED Final   Bordetella Parapertussis NOT DETECTED NOT DETECTED Final   Chlamydophila pneumoniae NOT DETECTED NOT DETECTED Final   Mycoplasma pneumoniae NOT DETECTED NOT DETECTED Final    Comment: Performed at [facility], [address], [place], [place] [address]         Radiology Studies: No results found.         LOS: 1 day   Time spent= 35 mins    Miguel Rota, MD [facility]  If 7PM-7AM, please contact night-coverage  02/24/2023, 1:00 PM

## 2023-02-24 NOTE — Inpatient Diabetes Management (Signed)
Inpatient Diabetes Program Recommendations  AACE/ADA: New Consensus Statement on Inpatient Glycemic Control   Target Ranges:  Prepandial:   less than 140 mg/dL      Peak postprandial:   less than 180 mg/dL (1-2 hours)      Critically ill patients:  140 - 180 mg/dL    Latest Reference Range & Units 02/23/23 08:24 02/23/23 12:23 02/23/23 17:11 02/23/23 19:48 02/23/23 23:44 02/24/23 04:20 02/24/23 08:14  Glucose-Capillary 70 - 99 mg/dL 865 (H) 784 (H) 696 (H) 329 (H) 313 (H) 215 (H) 189 (H)    Review of Glycemic Control  Diabetes history: DM2 Outpatient Diabetes medications: Metformin XR 500 mg BID, Ozempic 1 mg Qweek (Sunday) Current orders for Inpatient glycemic control: Novolog 0-9 units Q4H; Prednisone 40 mg QAM   Inpatient Diabetes Program Recommendations:     Insulin: Noted patient received Solumedrol 125 mg on 02/22/23 and then 40 mg on 02/23/23; steroids have decreased to Prednisone. CBGs ranged from 189-382 mg/dl over the past 24 hours.  May want to consider ordering one time Semglee 10 units x1 now.  Thanks, Orlando Penner, RN, MSN, CDCES Diabetes Coordinator Inpatient Diabetes Program [phone] (Team Pager from 8am to 5pm)

## 2023-02-24 NOTE — Plan of Care (Signed)
Problem: Education: Goal: Knowledge of General Education information will improve Description: Including pain rating scale, medication(s)/side effects and non-pharmacologic comfort measures 02/24/2023 0719 by Pilar Plate, RN Outcome: Progressing 02/24/2023 0718 by Pilar Plate, RN Outcome: Progressing   Problem: Health Behavior/Discharge Planning: Goal: Ability to manage health-related needs will improve 02/24/2023 0719 by Pilar Plate, RN Outcome: Progressing 02/24/2023 0718 by Pilar Plate, RN Outcome: Progressing   Problem: Clinical Measurements: Goal: Ability to maintain clinical measurements within normal limits will improve 02/24/2023 0719 by Pilar Plate, RN Outcome: Progressing 02/24/2023 0718 by Pilar Plate, RN Outcome: Progressing Goal: Will remain free from infection 02/24/2023 0719 by Pilar Plate, RN Outcome: Progressing 02/24/2023 0718 by Pilar Plate, RN Outcome: Progressing Goal: Diagnostic test results will improve 02/24/2023 0719 by Pilar Plate, RN Outcome: Progressing 02/24/2023 0718 by Pilar Plate, RN Outcome: Progressing Goal: Respiratory complications will improve 02/24/2023 0719 by Pilar Plate, RN Outcome: Progressing 02/24/2023 0718 by Pilar Plate, RN Outcome: Progressing Goal: Cardiovascular complication will be avoided 02/24/2023 0719 by Pilar Plate, RN Outcome: Progressing 02/24/2023 0718 by Pilar Plate, RN Outcome: Progressing   Problem: Activity: Goal: Risk for activity intolerance will decrease 02/24/2023 0719 by Pilar Plate, RN Outcome: Progressing 02/24/2023 0718 by Pilar Plate, RN Outcome: Progressing   Problem: Nutrition: Goal: Adequate nutrition will be maintained 02/24/2023 0719 by Pilar Plate, RN Outcome: Progressing 02/24/2023 0718 by Pilar Plate, RN Outcome: Progressing   Problem:  Coping: Goal: Level of anxiety will decrease 02/24/2023 0719 by Pilar Plate, RN Outcome: Progressing 02/24/2023 0718 by Pilar Plate, RN Outcome: Progressing   Problem: Elimination: Goal: Will not experience complications related to bowel motility 02/24/2023 0719 by Pilar Plate, RN Outcome: Progressing 02/24/2023 0718 by Pilar Plate, RN Outcome: Progressing Goal: Will not experience complications related to urinary retention 02/24/2023 0719 by Pilar Plate, RN Outcome: Progressing 02/24/2023 0718 by Pilar Plate, RN Outcome: Progressing   Problem: Pain Managment: Goal: General experience of comfort will improve and/or be controlled 02/24/2023 0719 by Pilar Plate, RN Outcome: Progressing 02/24/2023 0718 by Pilar Plate, RN Outcome: Progressing   Problem: Safety: Goal: Ability to remain free from injury will improve 02/24/2023 0719 by Pilar Plate, RN Outcome: Progressing 02/24/2023 0718 by Pilar Plate, RN Outcome: Progressing   Problem: Skin Integrity: Goal: Risk for impaired skin integrity will decrease 02/24/2023 0719 by Pilar Plate, RN Outcome: Progressing 02/24/2023 0718 by Pilar Plate, RN Outcome: Progressing   Problem: Education: Goal: Knowledge of disease or condition will improve 02/24/2023 0719 by Pilar Plate, RN Outcome: Progressing 02/24/2023 0718 by Pilar Plate, RN Outcome: Progressing Goal: Knowledge of the prescribed therapeutic regimen will improve 02/24/2023 0719 by Pilar Plate, RN Outcome: Progressing 02/24/2023 0718 by Pilar Plate, RN Outcome: Progressing Goal: Individualized Educational Video(s) 02/24/2023 0719 by Pilar Plate, RN Outcome: Progressing 02/24/2023 0718 by Pilar Plate, RN Outcome: Progressing   Problem: Activity: Goal: Ability to tolerate increased activity will improve 02/24/2023 0719 by  Pilar Plate, RN Outcome: Progressing 02/24/2023 0718 by Pilar Plate, RN Outcome: Progressing Goal: Will verbalize the importance of balancing activity with adequate rest periods 02/24/2023 0719 by Pilar Plate, RN Outcome: Progressing 02/24/2023 0718 by Pilar Plate, RN Outcome: Progressing   Problem: Respiratory: Goal: Ability to maintain a clear airway will improve 02/24/2023 0719 by Pilar Plate, RN Outcome:  Progressing 02/24/2023 0718 by Pilar Plate, RN Outcome: Progressing Goal: Levels of oxygenation will improve 02/24/2023 0719 by Pilar Plate, RN Outcome: Progressing 02/24/2023 0718 by Pilar Plate, RN Outcome: Progressing Goal: Ability to maintain adequate ventilation will improve 02/24/2023 0719 by Pilar Plate, RN Outcome: Progressing 02/24/2023 0718 by Pilar Plate, RN Outcome: Progressing   Problem: Activity: Goal: Ability to tolerate increased activity will improve 02/24/2023 0719 by Pilar Plate, RN Outcome: Progressing 02/24/2023 0718 by Pilar Plate, RN Outcome: Progressing   Problem: Clinical Measurements: Goal: Ability to maintain a body temperature in the normal range will improve 02/24/2023 0719 by Pilar Plate, RN Outcome: Progressing 02/24/2023 0718 by Pilar Plate, RN Outcome: Progressing   Problem: Respiratory: Goal: Ability to maintain adequate ventilation will improve 02/24/2023 0719 by Pilar Plate, RN Outcome: Progressing 02/24/2023 0718 by Pilar Plate, RN Outcome: Progressing Goal: Ability to maintain a clear airway will improve 02/24/2023 0719 by Pilar Plate, RN Outcome: Progressing 02/24/2023 0718 by Pilar Plate, RN Outcome: Progressing   Problem: Education: Goal: Ability to describe self-care measures that may prevent or decrease complications (Diabetes Survival Skills Education) will  improve 02/24/2023 0719 by Pilar Plate, RN Outcome: Progressing 02/24/2023 0718 by Pilar Plate, RN Outcome: Progressing Goal: Individualized Educational Video(s) 02/24/2023 0719 by Pilar Plate, RN Outcome: Progressing 02/24/2023 0718 by Pilar Plate, RN Outcome: Progressing   Problem: Coping: Goal: Ability to adjust to condition or change in health will improve 02/24/2023 0719 by Pilar Plate, RN Outcome: Progressing 02/24/2023 0718 by Pilar Plate, RN Outcome: Progressing   Problem: Fluid Volume: Goal: Ability to maintain a balanced intake and output will improve 02/24/2023 0719 by Pilar Plate, RN Outcome: Progressing 02/24/2023 0718 by Pilar Plate, RN Outcome: Progressing   Problem: Education: Goal: Knowledge of General Education information will improve Description: Including pain rating scale, medication(s)/side effects and non-pharmacologic comfort measures 02/24/2023 0719 by Pilar Plate, RN Outcome: Progressing 02/24/2023 0718 by Pilar Plate, RN Outcome: Progressing   Problem: Health Behavior/Discharge Planning: Goal: Ability to manage health-related needs will improve 02/24/2023 0719 by Pilar Plate, RN Outcome: Progressing 02/24/2023 0718 by Pilar Plate, RN Outcome: Progressing   Problem: Clinical Measurements: Goal: Ability to maintain clinical measurements within normal limits will improve 02/24/2023 0719 by Pilar Plate, RN Outcome: Progressing 02/24/2023 0718 by Pilar Plate, RN Outcome: Progressing Goal: Will remain free from infection 02/24/2023 0719 by Pilar Plate, RN Outcome: Progressing 02/24/2023 0718 by Pilar Plate, RN Outcome: Progressing Goal: Diagnostic test results will improve 02/24/2023 0719 by Pilar Plate, RN Outcome: Progressing 02/24/2023 0718 by Pilar Plate, RN Outcome: Progressing Goal:  Respiratory complications will improve 02/24/2023 0719 by Pilar Plate, RN Outcome: Progressing 02/24/2023 0718 by Pilar Plate, RN Outcome: Progressing Goal: Cardiovascular complication will be avoided 02/24/2023 0719 by Pilar Plate, RN Outcome: Progressing 02/24/2023 0718 by Pilar Plate, RN Outcome: Progressing   Problem: Activity: Goal: Risk for activity intolerance will decrease 02/24/2023 0719 by Pilar Plate, RN Outcome: Progressing 02/24/2023 0718 by Pilar Plate, RN Outcome: Progressing   Problem: Nutrition: Goal: Adequate nutrition will be maintained 02/24/2023 0719 by Pilar Plate, RN Outcome: Progressing 02/24/2023 0718 by Pilar Plate, RN Outcome: Progressing   Problem: Coping: Goal: Level of anxiety will decrease 02/24/2023 0719 by Pilar Plate, RN Outcome: Progressing 02/24/2023 0718 by Pilar Plate, RN  Outcome: Progressing   Problem: Elimination: Goal: Will not experience complications related to bowel motility 02/24/2023 0719 by Pilar Plate, RN Outcome: Progressing 02/24/2023 0718 by Pilar Plate, RN Outcome: Progressing Goal: Will not experience complications related to urinary retention 02/24/2023 0719 by Pilar Plate, RN Outcome: Progressing 02/24/2023 0718 by Pilar Plate, RN Outcome: Progressing   Problem: Pain Managment: Goal: General experience of comfort will improve and/or be controlled 02/24/2023 0719 by Pilar Plate, RN Outcome: Progressing 02/24/2023 0718 by Pilar Plate, RN Outcome: Progressing   Problem: Safety: Goal: Ability to remain free from injury will improve 02/24/2023 0719 by Pilar Plate, RN Outcome: Progressing 02/24/2023 0718 by Pilar Plate, RN Outcome: Progressing   Problem: Skin Integrity: Goal: Risk for impaired skin integrity will decrease 02/24/2023 0719 by Pilar Plate,  RN Outcome: Progressing 02/24/2023 0718 by Pilar Plate, RN Outcome: Progressing

## 2023-02-24 NOTE — Plan of Care (Signed)
Problem: Education: Goal: Knowledge of General Education information will improve Description: Including pain rating scale, medication(s)/side effects and non-pharmacologic comfort measures Outcome: Progressing   Problem: Health Behavior/Discharge Planning: Goal: Ability to manage health-related needs will improve Outcome: Progressing   Problem: Clinical Measurements: Goal: Ability to maintain clinical measurements within normal limits will improve Outcome: Progressing Goal: Will remain free from infection Outcome: Progressing Goal: Diagnostic test results will improve Outcome: Progressing Goal: Respiratory complications will improve Outcome: Progressing Goal: Cardiovascular complication will be avoided Outcome: Progressing   Problem: Activity: Goal: Risk for activity intolerance will decrease Outcome: Progressing   Problem: Nutrition: Goal: Adequate nutrition will be maintained Outcome: Progressing   Problem: Coping: Goal: Level of anxiety will decrease Outcome: Progressing   Problem: Elimination: Goal: Will not experience complications related to bowel motility Outcome: Progressing Goal: Will not experience complications related to urinary retention Outcome: Progressing   Problem: Pain Managment: Goal: General experience of comfort will improve and/or be controlled Outcome: Progressing   Problem: Safety: Goal: Ability to remain free from injury will improve Outcome: Progressing   Problem: Skin Integrity: Goal: Risk for impaired skin integrity will decrease Outcome: Progressing   Problem: Education: Goal: Knowledge of disease or condition will improve Outcome: Progressing Goal: Knowledge of the prescribed therapeutic regimen will improve Outcome: Progressing Goal: Individualized Educational Video(s) Outcome: Progressing   Problem: Activity: Goal: Ability to tolerate increased activity will improve Outcome: Progressing Goal: Will verbalize the  importance of balancing activity with adequate rest periods Outcome: Progressing   Problem: Respiratory: Goal: Ability to maintain a clear airway will improve Outcome: Progressing Goal: Levels of oxygenation will improve Outcome: Progressing Goal: Ability to maintain adequate ventilation will improve Outcome: Progressing   Problem: Activity: Goal: Ability to tolerate increased activity will improve Outcome: Progressing   Problem: Clinical Measurements: Goal: Ability to maintain a body temperature in the normal range will improve Outcome: Progressing   Problem: Respiratory: Goal: Ability to maintain adequate ventilation will improve Outcome: Progressing Goal: Ability to maintain a clear airway will improve Outcome: Progressing   Problem: Education: Goal: Ability to describe self-care measures that may prevent or decrease complications (Diabetes Survival Skills Education) will improve Outcome: Progressing Goal: Individualized Educational Video(s) Outcome: Progressing   Problem: Coping: Goal: Ability to adjust to condition or change in health will improve Outcome: Progressing   Problem: Fluid Volume: Goal: Ability to maintain a balanced intake and output will improve Outcome: Progressing   Problem: Education: Goal: Knowledge of General Education information will improve Description: Including pain rating scale, medication(s)/side effects and non-pharmacologic comfort measures Outcome: Progressing   Problem: Health Behavior/Discharge Planning: Goal: Ability to manage health-related needs will improve Outcome: Progressing   Problem: Clinical Measurements: Goal: Ability to maintain clinical measurements within normal limits will improve Outcome: Progressing Goal: Will remain free from infection Outcome: Progressing Goal: Diagnostic test results will improve Outcome: Progressing Goal: Respiratory complications will improve Outcome: Progressing Goal: Cardiovascular  complication will be avoided Outcome: Progressing   Problem: Activity: Goal: Risk for activity intolerance will decrease Outcome: Progressing   Problem: Nutrition: Goal: Adequate nutrition will be maintained Outcome: Progressing   Problem: Coping: Goal: Level of anxiety will decrease Outcome: Progressing   Problem: Elimination: Goal: Will not experience complications related to bowel motility Outcome: Progressing Goal: Will not experience complications related to urinary retention Outcome: Progressing   Problem: Pain Managment: Goal: General experience of comfort will improve and/or be controlled Outcome: Progressing   Problem: Safety: Goal: Ability to remain free from injury will improve  Outcome: Progressing   Problem: Skin Integrity: Goal: Risk for impaired skin integrity will decrease Outcome: Progressing

## 2023-02-25 ENCOUNTER — Other Ambulatory Visit (HOSPITAL_COMMUNITY): Payer: Self-pay

## 2023-02-25 DIAGNOSIS — J9601 Acute respiratory failure with hypoxia: Secondary | ICD-10-CM | POA: Diagnosis not present

## 2023-02-25 LAB — BASIC METABOLIC PANEL
Anion gap: 10 (ref 5–15)
BUN: 17 mg/dL (ref 6–20)
CO2: 26 mmol/L (ref 22–32)
Calcium: 8.2 mg/dL — ABNORMAL LOW (ref 8.9–10.3)
Chloride: 102 mmol/L (ref 98–111)
Creatinine, Ser: 0.57 mg/dL (ref 0.44–1.00)
GFR, Estimated: >60 mL/min (ref >60)
Glucose, Bld: 197 mg/dL — ABNORMAL HIGH (ref 70–99)
Potassium: 3.3 mmol/L — ABNORMAL LOW (ref 3.5–5.1)
Sodium: 138 mmol/L (ref 135–145)

## 2023-02-25 LAB — CBC
HCT: 38.3 % (ref 36.0–46.0)
Hemoglobin: 12.4 g/dL (ref 12.0–15.0)
MCH: 28.4 pg (ref 26.0–34.0)
MCHC: 32.4 g/dL (ref 30.0–36.0)
MCV: 87.6 fL (ref 80.0–100.0)
Platelets: 201 10*3/uL (ref 150–400)
RBC: 4.37 MIL/uL (ref 3.87–5.11)
RDW: 13.5 % (ref 11.5–15.5)
WBC: 6.1 10*3/uL (ref 4.0–10.5)
nRBC: 0 % (ref 0.0–0.2)

## 2023-02-25 LAB — MAGNESIUM: Magnesium: 2.1 mg/dL (ref 1.7–2.4)

## 2023-02-25 LAB — LEGIONELLA PNEUMOPHILA SEROGP 1 UR AG: L. pneumophila Serogp 1 Ur Ag: NEGATIVE

## 2023-02-25 LAB — GLUCOSE, CAPILLARY
Glucose-Capillary: 284 mg/dL — ABNORMAL HIGH (ref 70–99)
Glucose-Capillary: 197 mg/dL — ABNORMAL HIGH (ref 70–99)
Glucose-Capillary: 121 mg/dL — ABNORMAL HIGH (ref 70–99)
Glucose-Capillary: 215 mg/dL — ABNORMAL HIGH (ref 70–99)

## 2023-02-25 LAB — PHOSPHORUS: Phosphorus: 2.8 mg/dL (ref 2.5–4.6)

## 2023-02-25 MED ORDER — ALBUTEROL SULFATE HFA 108 (90 BASE) MCG/ACT IN AERS
2.0000 | INHALATION_SPRAY | Freq: Four times a day (QID) | RESPIRATORY_TRACT | 0 refills | Status: AC | PRN
  Filled 2023-02-25: qty 6.7, 30d supply, fill #0

## 2023-02-25 MED ORDER — IPRATROPIUM-ALBUTEROL 0.5-2.5 (3) MG/3ML IN SOLN: 3.0000 mL | Freq: Two times a day (BID) | RESPIRATORY_TRACT | Status: DC

## 2023-02-25 MED ORDER — PULMICORT FLEXHALER 90 MCG/ACT IN AEPB
1.0000 | INHALATION_SPRAY | Freq: Two times a day (BID) | RESPIRATORY_TRACT | 0 refills | Status: AC
  Filled 2023-02-25: qty 1, 30d supply, fill #0

## 2023-02-25 MED ORDER — POTASSIUM CHLORIDE CRYS ER 20 MEQ PO TBCR
40.0000 meq | EXTENDED_RELEASE_TABLET | ORAL | Status: AC
Start: 2023-02-25 — End: 2023-02-25
  Administered 2023-02-25 (×2): 40 meq via ORAL
  Filled 2023-02-25 (×2): qty 2

## 2023-02-25 MED ORDER — PREDNISONE 20 MG PO TABS
40.0000 mg | ORAL_TABLET | Freq: Every day | ORAL | 0 refills | Status: AC
  Filled 2023-02-25: qty 8, 4d supply, fill #0

## 2023-02-25 MED ORDER — INSULIN GLARGINE-YFGN 100 UNIT/ML ~~LOC~~ SOLN
5.0000 [IU] | Freq: Once | SUBCUTANEOUS | Status: AC
  Administered 2023-02-25: 5 [IU] via SUBCUTANEOUS
  Filled 2023-02-25: qty 0.05

## 2023-02-25 MED ORDER — AZITHROMYCIN 250 MG PO TABS
500.0000 mg | ORAL_TABLET | Freq: Every day | ORAL | 0 refills | Status: AC
  Filled 2023-02-25: qty 4, 2d supply, fill #0

## 2023-02-25 MED ORDER — IPRATROPIUM-ALBUTEROL 0.5-2.5 (3) MG/3ML IN SOLN
3.0000 mL | Freq: Two times a day (BID) | RESPIRATORY_TRACT | 0 refills | Status: AC
  Filled 2023-02-25: qty 180, 30d supply, fill #0

## 2023-02-25 NOTE — Discharge Summary (Signed)
 Physician Discharge Summary  Analys Ryden HQI:[id number] DOB: [birth year] DOA: 02/22/2023  PCP: Jarrett Soho, PA-C  Admit date: 02/22/2023 Discharge date: 02/25/2023  Admitted From: Home Disposition:  Home  Recommendations for Outpatient Follow-up:  Follow up with PCP in 1-2 weeks Please obtain BMP/CBC in one week your next doctors visit.  PO Prednisone and Azithromycin to finish her course.  Nebs and inhalers prescribed.    Discharge Condition: Stable CODE STATUS: Full Diet recommendation: Diabetic.   Brief Narrative:  53 year old with history of DM2, DVT, bronchitis, sleep apnea, tobacco use, HLD, OSA on CPAP admitted for shortness of breath.  Found to have influenza causing hypoxia.  Chest x-ray showed airway thickening.  CT abdomen pelvis showed new tree-in-bud groundglass opacity in bilateral lower lung concerning for aspiration versus bronchopneumonia.  Upon admission patient was started on broad-spectrum antibiotics eventually narrowed down to azithromycin p.o..  BNP was slightly elevated therefore received a dose of IV Lasix.  Over the course of hospitalization she slowly started improving.  Today medically ready for discharge.    Assessment & Plan:  Principal Problem:   Acute respiratory failure with hypoxia (HCC) Active Problems:   Diabetes mellitus type II, non insulin dependent (HCC)   History of deep vein thrombosis (DVT) of lower extremity   Hypokalemia   Hyperlipidemia   Essential hypertension   COPD with acute exacerbation (HCC)   Tobacco abuse   CAP (community acquired pneumonia)   Influenza A with pneumonia   Acute hypoxic respiratory failure (HCC)     Acute respiratory failure with hypoxia; improved.  CAP-bacterial versus viral pneumonia Influenza A positive Acute COPD exacerbation -Symptomatically slowly improving.  Procalcitonin negative, discontinue Rocephin.  Finish total 5 days of azithromycin  EOT 2/16 - Bronchodilators, I-S/flutter valve.  Pulmicort.  - Slowly tapering off steroids. - Procalcitonin negative.  BNP slightly elevated.  Received a dose of lasix.    Diabetes mellitus type II, with uncontrolled hyperglycemia not on long-term insulin: Resume home regimen upon dc.    DVT history -no longer on anticoagulation   Hypokalemia / Hypophosphatemia: As needed repletion   Hyperlipidemia Continue statin.     Essential hypertension BP stable.  Hold home lisinopril IV as needed   Tobacco abuse: Cessation advised offered nicotine patch   OSA: Cont CPAP nightly   Obesity w/ Body mass index is 51.15 kg/m. : Will benefit with PCP follow-up, weight loss  healthy lifestyle  DVT prophylaxis: SCDs Start: 02/22/23 2002    Code Status: Full Code Family Communication:  Husband on the phone during my visit.  Status is: Inpatient Remains inpatient appropriate because:DC today if amb pulse ox stable.     Subjective: Tells me she feels much better today Wishing to go home  Examination:  General exam: Appears calm and comfortable  Respiratory system: Mild b/l rhonchi but improved Cardiovascular system: S1 & S2 heard, RRR. No JVD, murmurs, rubs, gallops or clicks. No pedal edema. Gastrointestinal system: Abdomen is nondistended, soft and nontender. No organomegaly or masses felt. Normal bowel sounds heard. Central nervous system: Alert and oriented. No focal neurological deficits. Extremities: Symmetric 5 x 5 power. Skin: No rashes, lesions or ulcers Psychiatry: Judgement and insight appear normal. Mood & affect appropriate.    Discharge Diagnoses:  Principal Problem:   Acute respiratory failure with hypoxia (HCC) Active Problems:   Diabetes mellitus type II, non insulin dependent (HCC)   History of deep vein thrombosis (DVT) of lower extremity   Hypokalemia   Hyperlipidemia   Essential  hypertension   COPD with acute exacerbation (HCC)   Tobacco abuse   CAP (community acquired pneumonia)   Influenza A with  pneumonia   Acute hypoxic respiratory failure (HCC)      Discharge Exam: Vitals:   02/25/23 0955 02/25/23 1133  BP:  137/72  Pulse:  78  Resp:  18  Temp:  (!) 97.5 F (36.4 C)  SpO2: 95% 94%   Vitals:   02/25/23 0439 02/25/23 0952 02/25/23 0955 02/25/23 1133  BP: (!) 115/59   137/72  Pulse: 66   78  Resp: 19   18  Temp: 98.1 F (36.7 C)   (!) 97.5 F (36.4 C)  TempSrc: Oral   Oral  SpO2: 94% 95% 95% 94%  Weight:      Height:          Discharge Instructions   Allergies as of 02/25/2023       Reactions   Musk Shortness Of Breath, Other (See Comments)   Bronchial spasms   Other    Fresh cut grass and bleach -bronchial spasms        Medication List     TAKE these medications    albuterol 108 (90 Base) MCG/ACT inhaler Commonly known as: VENTOLIN HFA Inhale 2 puffs into the lungs every 6 (six) hours as needed for wheezing or shortness of breath.   ALPRAZolam 0.25 MG tablet Commonly known as: XANAX Take 0.25 mg by mouth 2 (two) times daily as needed for anxiety.   atorvastatin 20 MG tablet Commonly known as: LIPITOR Take 20 mg by mouth at bedtime.   azithromycin 250 MG tablet Commonly known as: ZITHROMAX Take 2 tablets (500 mg total) by mouth daily for 2 days.   chlorpheniramine-HYDROcodone 10-8 MG/5ML Suer Commonly known as: TUSSIONEX Take 5 mLs by mouth every 6 (six) hours.   cholecalciferol 25 MCG (1000 UNIT) tablet Commonly known as: VITAMIN D3 Take 2,000 Units by mouth every evening.   guaiFENesin-dextromethorphan 100-10 MG/5ML syrup Commonly known as: ROBITUSSIN DM Take 5 mLs by mouth every 4 (four) hours as needed for cough.   ipratropium-albuterol 0.5-2.5 (3) MG/3ML Soln Commonly known as: DUONEB Inhale 3 mLs ( 1 vial ) by nebulization 2 (two) times daily.   lisinopril 5 MG tablet Commonly known as: ZESTRIL Take 5 mg by mouth daily.   metFORMIN 500 MG 24 hr tablet Commonly known as: GLUCOPHAGE-XR Take 500 mg by mouth 2 (two)  times daily.   oxyCODONE 5 MG immediate release tablet Commonly known as: Oxy IR/ROXICODONE Take 1 tablet (5 mg total) by mouth every 6 (six) hours as needed for moderate pain.   Ozempic (1 MG/DOSE) 4 MG/3ML Sopn Generic drug: Semaglutide (1 MG/DOSE) Inject 1 mg into the skin every Sunday.   PARoxetine 20 MG tablet Commonly known as: PAXIL Take 20 mg by mouth every morning.   predniSONE 20 MG tablet Commonly known as: DELTASONE Take 2 tablets (40 mg total) by mouth daily with breakfast for 4 days.   Pulmicort Flexhaler 90 MCG/ACT inhaler Generic drug: Budesonide Inhale 1-2 puffs into the lungs 2 (two) times daily.   RESTORA PO Take 1 capsule by mouth every evening.               Durable Medical Equipment  (From admission, onward)           Start     Ordered   02/25/23 0900  For home use only DME Nebulizer/meds  Once       Question  Answer Comment  Patient needs a nebulizer to treat with the following condition COPD (chronic obstructive pulmonary disease) (HCC)   Length of Need Lifetime      02/25/23 0859            Allergies  Allergen Reactions   Musk Shortness Of Breath and Other (See Comments)    Bronchial spasms   Other     Fresh cut grass and bleach -bronchial spasms    You were cared for by a hospitalist during your hospital stay. If you have any questions about your discharge medications or the care you received while you were in the hospital after you are discharged, you can call the unit and asked to speak with the hospitalist on call if the hospitalist that took care of you is not available. Once you are discharged, your primary care physician will handle any further medical issues. Please note that no refills for any discharge medications will be authorized once you are discharged, as it is imperative that you return to your primary care physician (or establish a relationship with a primary care physician if you do not have one) for your  aftercare needs so that they can reassess your need for medications and monitor your lab values.  You were cared for by a hospitalist during your hospital stay. If you have any questions about your discharge medications or the care you received while you were in the hospital after you are discharged, you can call the unit and asked to speak with the hospitalist on call if the hospitalist that took care of you is not available. Once you are discharged, your primary care physician will handle any further medical issues. Please note that NO REFILLS for any discharge medications will be authorized once you are discharged, as it is imperative that you return to your primary care physician (or establish a relationship with a primary care physician if you do not have one) for your aftercare needs so that they can reassess your need for medications and monitor your lab values.  Please request your Prim.MD to go over all Hospital Tests and Procedure/Radiological results at the follow up, please get all Hospital records sent to your Prim MD by signing hospital release before you go home.  Get CBC, CMP, 2 view Chest X ray checked  by Primary MD during your next visit or SNF MD in 5-7 days ( we routinely change or add medications that can affect your baseline labs and fluid status, therefore we recommend that you get the mentioned basic workup next visit with your PCP, your PCP may decide not to get them or add new tests based on their clinical decision)  On your next visit with your primary care physician please Get Medicines reviewed and adjusted.  If you experience worsening of your admission symptoms, develop shortness of breath, life threatening emergency, suicidal or homicidal thoughts you must seek medical attention immediately by calling 911 or calling your MD immediately  if symptoms less severe.  You Must read complete instructions/literature along with all the possible adverse reactions/side effects for  all the Medicines you take and that have been prescribed to you. Take any new Medicines after you have completely understood and accpet all the possible adverse reactions/side effects.   Do not drive, operate heavy machinery, perform activities at heights, swimming or participation in water activities or provide baby sitting services if your were admitted for syncope or siezures until you have seen by Primary MD or a Neurologist  and advised to do so again.  Do not drive when taking Pain medications.   Procedures/Studies: CT ABDOMEN PELVIS W CONTRAST Result Date: 02/22/2023 CLINICAL DATA:  RLQ abdominal pain RLQ abd pain, feels "just like when i had a rectus sheath hematoma from coughing too hard" EXAM: CT ABDOMEN AND PELVIS WITH CONTRAST TECHNIQUE: Multidetector CT imaging of the abdomen and pelvis was performed using the standard protocol following bolus administration of intravenous contrast. RADIATION DOSE REDUCTION: This exam was performed according to the departmental dose-optimization program which includes automated exposure control, adjustment of the mA and/or kV according to patient size and/or use of iterative reconstruction technique. CONTRAST:  OMNIPAQUE IOHEXOL 300 MG/ML  SOLN COMPARISON:  CT scan abdomen and pelvis from 08/13/2020. FINDINGS: Lower chest: There are new tree-in-bud configuration ground-glass nodules in the bilateral lung bases, asymmetrically more involving the right lung lower lobe. Findings are likely secondary to aspiration versus bronchopneumonia. Correlate clinically. No pleural effusion. The heart is normal in size. No pericardial effusion. Hepatobiliary: The liver is normal in size. Non-cirrhotic configuration. No suspicious mass. No intrahepatic or extrahepatic bile duct dilation. Gallbladder is surgically absent. Pancreas: Unremarkable. No pancreatic ductal dilatation or surrounding inflammatory changes. Spleen: Within normal limits. No focal lesion.  Adrenals/Urinary Tract: Adrenal glands are unremarkable. No suspicious renal mass. Focal scarring noted in the left kidney lower pole, laterally. No nephroureterolithiasis or obstructive uropathy on either side. Urinary bladder is under distended, precluding optimal assessment. However, no large mass or stones identified. No perivesical fat stranding. Stomach/Bowel: There is a small diverticulum arising from the second part of duodenum. No disproportionate dilation of the small or large bowel loops. No evidence of abnormal bowel wall thickening or inflammatory changes. The appendix is unremarkable. There are scattered diverticula mainly in the sigmoid colon, without imaging signs of diverticulitis. Vascular/Lymphatic: No ascites or pneumoperitoneum. No abdominal or pelvic lymphadenopathy, by size criteria. No aneurysmal dilation of the major abdominal arteries. Reproductive: Normal-size anteverted uterus. There is a T-shaped intrauterine device, which appears in satisfactory position. Bilateral ovaries are within normal limits. There is focal dystrophic calcification in the left ovary, which is similar to the prior study and of minimal clinical significance. Other: There is a small fat containing umbilical hernia. The soft tissues and abdominal wall are otherwise unremarkable. Musculoskeletal: No suspicious osseous lesions. There are mild - moderate multilevel degenerative changes in the visualized spine. IMPRESSION: 1. No acute inflammatory process identified within the abdomen or pelvis. 2. There are new tree-in-bud configuration ground-glass nodules in the bilateral lung bases, more involving the right lung lower lobe. Findings are likely secondary to aspiration versus bronchopneumonia. Correlate clinically. Consider short-term follow-up examination in 6-12 weeks to document resolution. 3. Multiple other nonacute observations, as described above. Electronically Signed   By: Jules Schick M.D.   On: 02/22/2023  09:14   DG Chest 2 View Result Date: 02/22/2023 CLINICAL DATA:  Shortness of breath EXAM: CHEST - 2 VIEW COMPARISON:  10/25/2017 FINDINGS: Generalized airway thickening. There is no edema, consolidation, effusion, or pneumothorax. Normal heart size and mediastinal contours. IMPRESSION: Airway thickening without collapse or consolidation. Electronically Signed   By: Tiburcio Pea M.D.   On: 02/22/2023 06:57     The results of significant diagnostics from this hospitalization (including imaging, microbiology, ancillary and laboratory) are listed below for reference.     Microbiology: Recent Results (from the past 240 hours)  Resp panel by RT-PCR (RSV, Flu A&B, Covid) Anterior Nasal Swab     Status: Abnormal  Collection Time: 02/22/23  6:41 AM   Specimen: Anterior Nasal Swab  Result Value Ref Range Status   SARS Coronavirus 2 by RT PCR NEGATIVE NEGATIVE Final    Comment: (NOTE) SARS-CoV-2 target nucleic acids are NOT DETECTED.  The SARS-CoV-2 RNA is generally detectable in upper respiratory specimens during the acute phase of infection. The lowest concentration of SARS-CoV-2 viral copies this assay can detect is 138 copies/mL. A negative result does not preclude SARS-Cov-2 infection and should not be used as the sole basis for treatment or other patient management decisions. A negative result may occur with  improper specimen collection/handling, submission of specimen other than nasopharyngeal swab, presence of viral mutation(s) within the areas targeted by this assay, and inadequate number of viral copies(<138 copies/mL). A negative result must be combined with clinical observations, patient history, and epidemiological information. The expected result is Negative.  Fact Sheet for Patients:  BloggerCourse.com  Fact Sheet for Healthcare Providers:  SeriousBroker.it  This test is no t yet approved or cleared by the [place]  FDA and  has been authorized for detection and/or diagnosis of SARS-CoV-2 by FDA under an Emergency Use Authorization (EUA). This EUA will remain  in effect (meaning this test can be used) for the duration of the COVID-19 declaration under Section 564(b)(1) of the Act, 21 U.S.C.section 360bbb-3(b)(1), unless the authorization is terminated  or revoked sooner.       Influenza A by PCR POSITIVE (A) NEGATIVE Final   Influenza B by PCR NEGATIVE NEGATIVE Final    Comment: (NOTE) The Xpert Xpress SARS-CoV-2/FLU/RSV plus assay is intended as an aid in the diagnosis of influenza from Nasopharyngeal swab specimens and should not be used as a sole basis for treatment. Nasal washings and aspirates are unacceptable for Xpert Xpress SARS-CoV-2/FLU/RSV testing.  Fact Sheet for Patients: BloggerCourse.com  Fact Sheet for Healthcare Providers: SeriousBroker.it  This test is not yet approved or cleared by the [place] FDA and has been authorized for detection and/or diagnosis of SARS-CoV-2 by FDA under an Emergency Use Authorization (EUA). This EUA will remain in effect (meaning this test can be used) for the duration of the COVID-19 declaration under Section 564(b)(1) of the Act, 21 U.S.C. section 360bbb-3(b)(1), unless the authorization is terminated or revoked.     Resp Syncytial Virus by PCR NEGATIVE NEGATIVE Final    Comment: (NOTE) Fact Sheet for Patients: BloggerCourse.com  Fact Sheet for Healthcare Providers: SeriousBroker.it  This test is not yet approved or cleared by the [place] FDA and has been authorized for detection and/or diagnosis of SARS-CoV-2 by FDA under an Emergency Use Authorization (EUA). This EUA will remain in effect (meaning this test can be used) for the duration of the COVID-19 declaration under Section 564(b)(1) of the Act, 21 U.S.C. section  360bbb-3(b)(1), unless the authorization is terminated or revoked.  Performed at [facility], [address] [place], [place] [address]   MRSA Next Gen by PCR, Nasal     Status: None   Collection Time: 02/22/23 10:27 PM   Specimen: Nasal Mucosa; Nasal Swab  Result Value Ref Range Status   MRSA by PCR Next Gen NOT DETECTED NOT DETECTED Final    Comment: (NOTE) The GeneXpert MRSA Assay (FDA approved for NASAL specimens only), is one component of a comprehensive MRSA colonization surveillance program. It is not intended to diagnose MRSA infection nor to guide or monitor treatment for MRSA infections. Test performance is not FDA approved in patients less than 2 years  old. Performed at [facility], [address] [place], [place] [address]   Respiratory (~20 pathogens) panel by PCR     Status: Abnormal   Collection Time: 02/23/23  3:51 PM   Specimen: Nasopharyngeal Swab; Respiratory  Result Value Ref Range Status   Adenovirus NOT DETECTED NOT DETECTED Final   Coronavirus 229E NOT DETECTED NOT DETECTED Final    Comment: (NOTE) The Coronavirus on the Respiratory Panel, DOES NOT test for the novel  Coronavirus (2019 nCoV)    Coronavirus HKU1 NOT DETECTED NOT DETECTED Final   Coronavirus NL63 NOT DETECTED NOT DETECTED Final   Coronavirus OC43 NOT DETECTED NOT DETECTED Final   Metapneumovirus NOT DETECTED NOT DETECTED Final   Rhinovirus / Enterovirus NOT DETECTED NOT DETECTED Final   Influenza A H3 DETECTED (A) NOT DETECTED Final   Influenza B NOT DETECTED NOT DETECTED Final   Parainfluenza Virus 1 NOT DETECTED NOT DETECTED Final   Parainfluenza Virus 2 NOT DETECTED NOT DETECTED Final   Parainfluenza Virus 3 NOT DETECTED NOT DETECTED Final   Parainfluenza Virus 4 NOT DETECTED NOT DETECTED Final   Respiratory Syncytial Virus NOT DETECTED NOT DETECTED Final   Bordetella pertussis NOT DETECTED NOT DETECTED Final   Bordetella Parapertussis NOT  DETECTED NOT DETECTED Final   Chlamydophila pneumoniae NOT DETECTED NOT DETECTED Final   Mycoplasma pneumoniae NOT DETECTED NOT DETECTED Final    Comment: Performed at [facility], [address], [place], [place] [address]     Labs: BNP (last 3 results) Recent Labs    02/22/23 0744 02/24/23 0359  BNP 9.3 209.2*   Basic Metabolic Panel: Recent Labs  Lab 02/22/23 0744 02/22/23 2058 02/23/23 0416 02/24/23 0359 02/25/23 0412  NA 136 136 134* 139 138  K 3.4* 3.9 4.1 4.0 3.3*  CL 99 101 103 106 102  CO2 29 21* 23 25 26   GLUCOSE 208* 425* 288* 212* 197*  BUN 6 8 11 15 17   CREATININE 0.50 0.76 0.55 0.54 0.57  CALCIUM 8.2* 8.0* 8.1* 8.1* 8.2*  MG  --  1.8 2.0  --  2.1  PHOS  --  1.6* 2.2*  --  2.8   Liver Function Tests: Recent Labs  Lab 02/22/23 0744 02/23/23 0416  AST 39 42*  ALT 24 27  ALKPHOS 51 46  BILITOT 0.6 0.6  PROT 7.0 7.0  ALBUMIN 3.6 3.1*   Recent Labs  Lab 02/22/23 0744  LIPASE 31   No results for input(s): "AMMONIA" in the last 168 hours. CBC: Recent Labs  Lab 02/22/23 0744 02/23/23 0416 02/24/23 0359 02/25/23 0412  WBC 3.2* 2.6* 5.8 6.1  NEUTROABS 2.0  --   --   --   HGB 14.3 13.6 12.7 12.4  HCT 42.8 42.0 40.3 38.3  MCV 85.1 87.3 89.8 87.6  PLT 186 214 206 201   Cardiac Enzymes: No results for input(s): "CKTOTAL", "CKMB", "CKMBINDEX", "TROPONINI" in the last 168 hours. BNP: Invalid input(s): "POCBNP" CBG: Recent Labs  Lab 02/24/23 2233 02/25/23 0105 02/25/23 0436 02/25/23 0806 02/25/23 1130  GLUCAP 263* 284* 197* 121* 215*   D-Dimer No results for input(s): "DDIMER" in the last 72 hours. Hgb A1c Recent Labs    02/22/23 2058  HGBA1C 7.9*   Lipid Profile No results for input(s): "CHOL", "HDL", "LDLCALC", "TRIG", "CHOLHDL", "LDLDIRECT" in the last 72 hours. Thyroid function studies No results for input(s): "TSH", "T4TOTAL", "T3FREE", "THYROIDAB" in the last 72 hours.  Invalid input(s): "FREET3" Anemia work up No  results for input(s): "VITAMINB12", "FOLATE", "FERRITIN", "TIBC", "IRON", "RETICCTPCT" in the last 72 hours. Urinalysis    Component Value Date/Time   COLORURINE YELLOW 02/22/2023 0744   APPEARANCEUR CLEAR 02/22/2023 0744   LABSPEC 1.007 02/22/2023 0744   PHURINE 6.0 02/22/2023 0744   GLUCOSEU NEGATIVE 02/22/2023 0744   HGBUR TRACE (A) 02/22/2023 0744   BILIRUBINUR NEGATIVE 02/22/2023 0744   KETONESUR NEGATIVE 02/22/2023 0744   PROTEINUR NEGATIVE 02/22/2023 0744   UROBILINOGEN 0.2 07/03/2010 2205   NITRITE NEGATIVE 02/22/2023 0744   LEUKOCYTESUR TRACE (A) 02/22/2023 0744   Sepsis Labs Recent Labs  Lab 02/22/23 0744 02/23/23 0416 02/24/23 0359 02/25/23 0412  WBC 3.2* 2.6* 5.8 6.1   Microbiology Recent Results (from the past 240 hours)  Resp panel by RT-PCR (RSV, Flu A&B, Covid) Anterior Nasal Swab     Status: Abnormal   Collection Time: 02/22/23  6:41 AM   Specimen: Anterior Nasal Swab  Result Value Ref Range Status   SARS Coronavirus 2 by RT PCR NEGATIVE NEGATIVE Final    Comment: (NOTE) SARS-CoV-2 target nucleic acids are NOT DETECTED.  The SARS-CoV-2 RNA is generally detectable in upper respiratory specimens during the acute phase of infection. The lowest concentration of SARS-CoV-2 viral copies this assay can detect is 138 copies/mL. A negative result does not preclude SARS-Cov-2 infection and should not be used as the sole basis for treatment or other patient management decisions. A negative result may occur with  improper specimen collection/handling, submission of specimen other than nasopharyngeal swab, presence of viral mutation(s) within the areas targeted by this assay, and inadequate number of viral copies(<138 copies/mL). A negative result must be combined with clinical observations, patient history, and epidemiological information. The expected result is Negative.  Fact Sheet for Patients:  BloggerCourse.com  Fact Sheet for  Healthcare Providers:  SeriousBroker.it  This test is no t yet approved or cleared by the [place] FDA and  has been authorized for detection and/or diagnosis of SARS-CoV-2 by FDA under an Emergency Use Authorization (EUA). This EUA will remain  in effect (meaning this test can be used) for the duration of the COVID-19 declaration under Section 564(b)(1) of the Act, 21 U.S.C.section 360bbb-3(b)(1), unless the authorization is terminated  or revoked sooner.       Influenza A by PCR POSITIVE (A) NEGATIVE Final   Influenza B by PCR NEGATIVE NEGATIVE Final    Comment: (NOTE) The Xpert Xpress SARS-CoV-2/FLU/RSV plus assay is intended as an aid in the diagnosis of influenza from Nasopharyngeal swab specimens and should not be used as a sole basis for treatment. Nasal washings and aspirates are unacceptable for Xpert Xpress SARS-CoV-2/FLU/RSV testing.  Fact Sheet for Patients: BloggerCourse.com  Fact Sheet for Healthcare Providers: SeriousBroker.it  This test is not yet approved or cleared by the [place] FDA and has been authorized for detection and/or diagnosis of SARS-CoV-2 by FDA under an Emergency Use Authorization (EUA). This EUA will remain in effect (meaning this test can be used) for the duration of the COVID-19 declaration under Section 564(b)(1) of the Act, 21 U.S.C. section 360bbb-3(b)(1), unless the authorization is terminated or revoked.     Resp Syncytial Virus by PCR NEGATIVE NEGATIVE Final    Comment: (NOTE) Fact Sheet for Patients: BloggerCourse.com  Fact Sheet for Healthcare Providers: SeriousBroker.it  This test is not yet approved or cleared by the [place] FDA and has been authorized for detection and/or diagnosis of SARS-CoV-2 by FDA under an Emergency Use Authorization (EUA). This EUA will  remain in effect (meaning  this test can be used) for the duration of the COVID-19 declaration under Section 564(b)(1) of the Act, 21 U.S.C. section 360bbb-3(b)(1), unless the authorization is terminated or revoked.  Performed at [facility], [address] [place], [place] [address]   MRSA Next Gen by PCR, Nasal     Status: None   Collection Time: 02/22/23 10:27 PM   Specimen: Nasal Mucosa; Nasal Swab  Result Value Ref Range Status   MRSA by PCR Next Gen NOT DETECTED NOT DETECTED Final    Comment: (NOTE) The GeneXpert MRSA Assay (FDA approved for NASAL specimens only), is one component of a comprehensive MRSA colonization surveillance program. It is not intended to diagnose MRSA infection nor to guide or monitor treatment for MRSA infections. Test performance is not FDA approved in patients less than 70 years old. Performed at [facility], [address] [place], [place] [address]   Respiratory (~20 pathogens) panel by PCR     Status: Abnormal   Collection Time: 02/23/23  3:51 PM   Specimen: Nasopharyngeal Swab; Respiratory  Result Value Ref Range Status   Adenovirus NOT DETECTED NOT DETECTED Final   Coronavirus 229E NOT DETECTED NOT DETECTED Final    Comment: (NOTE) The Coronavirus on the Respiratory Panel, DOES NOT test for the novel  Coronavirus (2019 nCoV)    Coronavirus HKU1 NOT DETECTED NOT DETECTED Final   Coronavirus NL63 NOT DETECTED NOT DETECTED Final   Coronavirus OC43 NOT DETECTED NOT DETECTED Final   Metapneumovirus NOT DETECTED NOT DETECTED Final   Rhinovirus / Enterovirus NOT DETECTED NOT DETECTED Final   Influenza A H3 DETECTED (A) NOT DETECTED Final   Influenza B NOT DETECTED NOT DETECTED Final   Parainfluenza Virus 1 NOT DETECTED NOT DETECTED Final   Parainfluenza Virus 2 NOT DETECTED NOT DETECTED Final   Parainfluenza Virus 3 NOT DETECTED NOT DETECTED Final   Parainfluenza Virus 4 NOT DETECTED NOT DETECTED Final   Respiratory Syncytial  Virus NOT DETECTED NOT DETECTED Final   Bordetella pertussis NOT DETECTED NOT DETECTED Final   Bordetella Parapertussis NOT DETECTED NOT DETECTED Final   Chlamydophila pneumoniae NOT DETECTED NOT DETECTED Final   Mycoplasma pneumoniae NOT DETECTED NOT DETECTED Final    Comment: Performed at [facility], [address], [place], [place] [address]     Time coordinating discharge:  I have spent 35 minutes face to face with the patient and on the ward discussing the patients care, assessment, plan and disposition with other care givers. >50% of the time was devoted counseling the patient about the risks and benefits of treatment/Discharge disposition and coordinating care.   SIGNED:   Miguel Rota, MD  [facility] 02/25/2023, 1:05 PM   If 7PM-7AM, please contact night-coverage

## 2023-02-25 NOTE — Progress Notes (Signed)
 SATURATION QUALIFICATIONS: (This note is used to comply with regulatory documentation for home oxygen)  Patient Saturations on Room Air at Rest = 95%  Patient Saturations on Room Air while Ambulating =89%  Patient Saturations on 0 Liters of oxygen while Ambulating = 89%  Please briefly explain why patient needs home oxygen: No O2 needed

## 2023-02-25 NOTE — Progress Notes (Signed)
 AVS given to patient and explained at the bedside. Medications and follow up appointments have been explained with pt verbalizing understanding.

## 2023-02-28 ENCOUNTER — Other Ambulatory Visit (HOSPITAL_COMMUNITY): Payer: Self-pay

## 2023-03-03 DIAGNOSIS — J180 Bronchopneumonia, unspecified organism: Secondary | ICD-10-CM | POA: Diagnosis not present

## 2023-03-03 DIAGNOSIS — E876 Hypokalemia: Secondary | ICD-10-CM | POA: Diagnosis not present

## 2023-03-03 DIAGNOSIS — R197 Diarrhea, unspecified: Secondary | ICD-10-CM | POA: Diagnosis not present

## 2023-03-03 DIAGNOSIS — K30 Functional dyspepsia: Secondary | ICD-10-CM | POA: Diagnosis not present

## 2023-03-06 DIAGNOSIS — J180 Bronchopneumonia, unspecified organism: Secondary | ICD-10-CM | POA: Diagnosis not present

## 2023-03-20 DIAGNOSIS — Z6841 Body Mass Index (BMI) 40.0 and over, adult: Secondary | ICD-10-CM | POA: Diagnosis not present

## 2023-03-20 DIAGNOSIS — J1100 Influenza due to unidentified influenza virus with unspecified type of pneumonia: Secondary | ICD-10-CM | POA: Diagnosis not present

## 2023-03-20 DIAGNOSIS — J180 Bronchopneumonia, unspecified organism: Secondary | ICD-10-CM | POA: Diagnosis not present

## 2023-03-20 DIAGNOSIS — N2889 Other specified disorders of kidney and ureter: Secondary | ICD-10-CM | POA: Diagnosis not present

## 2023-04-18 DIAGNOSIS — E1169 Type 2 diabetes mellitus with other specified complication: Secondary | ICD-10-CM | POA: Diagnosis not present

## 2023-04-18 DIAGNOSIS — R809 Proteinuria, unspecified: Secondary | ICD-10-CM | POA: Diagnosis not present

## 2023-04-18 DIAGNOSIS — E1165 Type 2 diabetes mellitus with hyperglycemia: Secondary | ICD-10-CM | POA: Diagnosis not present

## 2023-04-21 ENCOUNTER — Other Ambulatory Visit: Payer: Self-pay | Admitting: Family Medicine

## 2023-04-21 DIAGNOSIS — J180 Bronchopneumonia, unspecified organism: Secondary | ICD-10-CM

## 2023-05-01 ENCOUNTER — Ambulatory Visit
Admission: RE | Admit: 2023-05-01 | Discharge: 2023-05-01 | Disposition: A | Discharge status: Home / Self Care | Source: Ambulatory Visit | Attending: Family Medicine | Admitting: Family Medicine

## 2023-05-01 DIAGNOSIS — J180 Bronchopneumonia, unspecified organism: Secondary | ICD-10-CM | POA: Diagnosis not present

## 2023-05-01 DIAGNOSIS — I700 Atherosclerosis of aorta: Secondary | ICD-10-CM | POA: Diagnosis not present

## 2023-05-01 MED ORDER — IOPAMIDOL (ISOVUE-300) INJECTION 61%
75.0000 mL | Freq: Once | INTRAVENOUS | Status: AC | PRN
  Administered 2023-05-01: 75 mL via INTRAVENOUS

## 2023-05-04 ENCOUNTER — Other Ambulatory Visit (HOSPITAL_COMMUNITY): Payer: Self-pay

## 2023-05-30 DIAGNOSIS — H5213 Myopia, bilateral: Secondary | ICD-10-CM | POA: Diagnosis not present

## 2023-05-30 DIAGNOSIS — E119 Type 2 diabetes mellitus without complications: Secondary | ICD-10-CM | POA: Diagnosis not present

## 2023-07-11 DIAGNOSIS — R6889 Other general symptoms and signs: Secondary | ICD-10-CM | POA: Diagnosis not present

## 2023-07-25 DIAGNOSIS — R809 Proteinuria, unspecified: Secondary | ICD-10-CM | POA: Diagnosis not present

## 2023-07-25 DIAGNOSIS — E1165 Type 2 diabetes mellitus with hyperglycemia: Secondary | ICD-10-CM | POA: Diagnosis not present

## 2023-07-25 DIAGNOSIS — E559 Vitamin D deficiency, unspecified: Secondary | ICD-10-CM | POA: Diagnosis not present

## 2023-07-25 DIAGNOSIS — E1169 Type 2 diabetes mellitus with other specified complication: Secondary | ICD-10-CM | POA: Diagnosis not present

## 2023-07-26 DIAGNOSIS — R140 Abdominal distension (gaseous): Secondary | ICD-10-CM | POA: Diagnosis not present

## 2023-07-26 DIAGNOSIS — N952 Postmenopausal atrophic vaginitis: Secondary | ICD-10-CM | POA: Diagnosis not present

## 2023-07-26 DIAGNOSIS — B372 Candidiasis of skin and nail: Secondary | ICD-10-CM | POA: Diagnosis not present

## 2023-07-26 DIAGNOSIS — Z01419 Encounter for gynecological examination (general) (routine) without abnormal findings: Secondary | ICD-10-CM | POA: Diagnosis not present

## 2023-07-26 DIAGNOSIS — T8339XA Other mechanical complication of intrauterine contraceptive device, initial encounter: Secondary | ICD-10-CM | POA: Diagnosis not present

## 2023-08-11 DIAGNOSIS — R6889 Other general symptoms and signs: Secondary | ICD-10-CM | POA: Diagnosis not present

## 2023-08-15 DIAGNOSIS — R140 Abdominal distension (gaseous): Secondary | ICD-10-CM | POA: Diagnosis not present

## 2023-08-15 DIAGNOSIS — Z30431 Encounter for routine checking of intrauterine contraceptive device: Secondary | ICD-10-CM | POA: Diagnosis not present

## 2023-08-15 DIAGNOSIS — T8332XA Displacement of intrauterine contraceptive device, initial encounter: Secondary | ICD-10-CM | POA: Diagnosis not present

## 2023-10-11 DIAGNOSIS — R6889 Other general symptoms and signs: Secondary | ICD-10-CM | POA: Diagnosis not present
